# Patient Record
Sex: Female | Born: 1970 | Race: White | Hispanic: No | Marital: Married | Smoking: Never smoker
Health system: Southern US, Community
[De-identification: ages and names within clinical notes are randomized; demographics above are authoritative.]

## PROBLEM LIST (undated history)

## (undated) DIAGNOSIS — K602 Anal fissure, unspecified: Secondary | ICD-10-CM

## (undated) DIAGNOSIS — F32A Depression, unspecified: Secondary | ICD-10-CM

## (undated) DIAGNOSIS — E785 Hyperlipidemia, unspecified: Secondary | ICD-10-CM

## (undated) DIAGNOSIS — T7840XA Allergy, unspecified, initial encounter: Secondary | ICD-10-CM

## (undated) DIAGNOSIS — F419 Anxiety disorder, unspecified: Secondary | ICD-10-CM

## (undated) DIAGNOSIS — F329 Major depressive disorder, single episode, unspecified: Secondary | ICD-10-CM

## (undated) DIAGNOSIS — F988 Other specified behavioral and emotional disorders with onset usually occurring in childhood and adolescence: Secondary | ICD-10-CM

## (undated) DIAGNOSIS — I4901 Ventricular fibrillation: Secondary | ICD-10-CM

## (undated) DIAGNOSIS — I4729 Other ventricular tachycardia: Secondary | ICD-10-CM

## (undated) DIAGNOSIS — Z8669 Personal history of other diseases of the nervous system and sense organs: Secondary | ICD-10-CM

## (undated) HISTORY — DX: Depression, unspecified: F32.A

## (undated) HISTORY — DX: Other ventricular tachycardia: I47.29

## (undated) HISTORY — PX: OTHER SURGICAL HISTORY: SHX169

## (undated) HISTORY — DX: Personal history of other diseases of the nervous system and sense organs: Z86.69

## (undated) HISTORY — DX: Ventricular fibrillation: I49.01

## (undated) HISTORY — DX: Allergy, unspecified, initial encounter: T78.40XA

## (undated) HISTORY — DX: Anxiety disorder, unspecified: F41.9

## (undated) HISTORY — DX: Other specified behavioral and emotional disorders with onset usually occurring in childhood and adolescence: F98.8

## (undated) HISTORY — PX: FINGER SURGERY: SHX640

## (undated) HISTORY — DX: Anal fissure, unspecified: K60.2

## (undated) HISTORY — DX: Hyperlipidemia, unspecified: E78.5

---
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
## 1998-10-01 ENCOUNTER — Emergency Department (HOSPITAL_COMMUNITY): Admission: EM | Admit: 1998-10-01 | Discharge: 1998-10-01 | Payer: Self-pay | Admitting: Emergency Medicine

## 1998-10-02 ENCOUNTER — Encounter: Payer: Self-pay | Admitting: Emergency Medicine

## 1999-07-31 ENCOUNTER — Ambulatory Visit (HOSPITAL_COMMUNITY): Admission: RE | Admit: 1999-07-31 | Discharge: 1999-07-31 | Payer: Self-pay | Admitting: *Deleted

## 1999-10-15 ENCOUNTER — Other Ambulatory Visit: Admission: RE | Admit: 1999-10-15 | Discharge: 1999-10-15 | Payer: Self-pay | Admitting: *Deleted

## 2000-01-12 ENCOUNTER — Inpatient Hospital Stay (HOSPITAL_COMMUNITY): Admission: AD | Admit: 2000-01-12 | Discharge: 2000-01-12 | Payer: Self-pay | Admitting: Obstetrics and Gynecology

## 2000-03-31 ENCOUNTER — Inpatient Hospital Stay (HOSPITAL_COMMUNITY): Admission: AD | Admit: 2000-03-31 | Discharge: 2000-03-31 | Payer: Self-pay | Admitting: *Deleted

## 2000-04-07 ENCOUNTER — Inpatient Hospital Stay (HOSPITAL_COMMUNITY): Admission: AD | Admit: 2000-04-07 | Discharge: 2000-04-09 | Payer: Self-pay | Admitting: *Deleted

## 2001-06-16 ENCOUNTER — Other Ambulatory Visit: Admission: RE | Admit: 2001-06-16 | Discharge: 2001-06-16 | Payer: Self-pay | Admitting: *Deleted

## 2002-08-07 ENCOUNTER — Other Ambulatory Visit: Admission: RE | Admit: 2002-08-07 | Discharge: 2002-08-07 | Payer: Self-pay | Admitting: *Deleted

## 2003-11-12 ENCOUNTER — Emergency Department (HOSPITAL_COMMUNITY): Admission: EM | Admit: 2003-11-12 | Discharge: 2003-11-13 | Payer: Self-pay | Admitting: Emergency Medicine

## 2004-03-23 HISTORY — PX: ABDOMINAL HYSTERECTOMY: SHX81

## 2004-04-25 ENCOUNTER — Other Ambulatory Visit: Admission: RE | Admit: 2004-04-25 | Discharge: 2004-04-25 | Payer: Self-pay | Admitting: Obstetrics and Gynecology

## 2004-05-05 ENCOUNTER — Encounter: Admission: RE | Admit: 2004-05-05 | Discharge: 2004-05-05 | Payer: Self-pay | Admitting: Family Medicine

## 2004-08-20 ENCOUNTER — Emergency Department (HOSPITAL_COMMUNITY): Admission: EM | Admit: 2004-08-20 | Discharge: 2004-08-20 | Payer: Self-pay | Admitting: Emergency Medicine

## 2004-08-25 ENCOUNTER — Observation Stay (HOSPITAL_COMMUNITY): Admission: RE | Admit: 2004-08-25 | Discharge: 2004-08-26 | Payer: Self-pay | Admitting: Obstetrics and Gynecology

## 2004-08-25 ENCOUNTER — Encounter (INDEPENDENT_AMBULATORY_CARE_PROVIDER_SITE_OTHER): Payer: Self-pay | Admitting: *Deleted

## 2005-12-29 ENCOUNTER — Emergency Department (HOSPITAL_COMMUNITY): Admission: EM | Admit: 2005-12-29 | Discharge: 2005-12-29 | Payer: Self-pay | Admitting: Emergency Medicine

## 2006-03-23 LAB — HM MAMMOGRAPHY

## 2006-12-29 ENCOUNTER — Ambulatory Visit: Payer: Self-pay | Admitting: Internal Medicine

## 2006-12-29 LAB — CONVERTED CEMR LAB
BUN: 11 mg/dL (ref 6–23)
Chloride: 108 meq/L (ref 96–112)
Eosinophils Absolute: 0.1 10*3/uL (ref 0.0–0.6)
Eosinophils Relative: 2.4 % (ref 0.0–5.0)
HCT: 39.7 % (ref 36.0–46.0)
Lymphocytes Relative: 27.9 % (ref 12.0–46.0)
Monocytes Relative: 8.7 % (ref 3.0–11.0)
Neutrophils Relative %: 59.8 % (ref 43.0–77.0)
Potassium: 3.9 meq/L (ref 3.5–5.1)
RBC: 4.43 M/uL (ref 3.87–5.11)
T3, Free: 2.8 pg/mL (ref 2.3–4.2)
TSH: 1.46 microintl units/mL (ref 0.35–5.50)
WBC: 5.3 10*3/uL (ref 4.5–10.5)

## 2007-01-11 ENCOUNTER — Encounter: Payer: Self-pay | Admitting: Internal Medicine

## 2007-01-11 ENCOUNTER — Ambulatory Visit: Payer: Self-pay

## 2007-01-11 ENCOUNTER — Ambulatory Visit: Payer: Self-pay | Admitting: Internal Medicine

## 2007-05-05 ENCOUNTER — Ambulatory Visit: Payer: Self-pay | Admitting: Family Medicine

## 2008-08-21 ENCOUNTER — Ambulatory Visit: Payer: Self-pay | Admitting: Family Medicine

## 2008-12-18 ENCOUNTER — Ambulatory Visit: Payer: Self-pay | Admitting: Family Medicine

## 2008-12-26 ENCOUNTER — Ambulatory Visit: Payer: Self-pay | Admitting: Radiology

## 2008-12-26 ENCOUNTER — Ambulatory Visit (HOSPITAL_BASED_OUTPATIENT_CLINIC_OR_DEPARTMENT_OTHER): Admission: RE | Admit: 2008-12-26 | Discharge: 2008-12-26 | Payer: Self-pay | Admitting: Neurosurgery

## 2009-07-17 ENCOUNTER — Ambulatory Visit (HOSPITAL_COMMUNITY): Admission: RE | Admit: 2009-07-17 | Discharge: 2009-07-17 | Payer: Self-pay | Admitting: Neurosurgery

## 2009-07-22 ENCOUNTER — Encounter: Admission: RE | Admit: 2009-07-22 | Discharge: 2009-07-22 | Payer: Self-pay | Admitting: Neurosurgery

## 2010-08-05 NOTE — Assessment & Plan Note (Signed)
[facility]                            CARDIOLOGY OFFICE NOTE   RON, JUNCO                      MRN:          [id number]  DATE:12/29/2006                            DOB:          Jul 11, 1970    NEW PATIENT EVALUATION   PRIMARY CARE Darielle Hancher:  Elpidio Galea, P.A.   REASON FOR EVALUATION:  Chest pain and palpitations.   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  She has a history of migraine headaches which were  likely hormonally related and have gotten better since her hysterectomy  in 2006.  She also has a history of palpitations due to frequent PVCs.  She previously has been worked up with an echocardiogram and Holter  monitor which was essentially unremarkable except for question of mild  mitral valve prolapse and frequent PVCs.  She previously was a fairly  heavy exerciser but has not done so for 2 years.  However, she is quite  active at work without problem and also manages to take care of her  three kids.   Over the past few months she has been noticing more frequent  palpitations.  She gets episodes about once or twice a week.  They last  15-20 minutes where her heart feels irregular, not necessarily fast just  irregular, and she has a funny feeling.  She has tried to catch these on  the monitor but by the time she hooks herself up they are gone.  She  also has intermittent chest pain which feels vise-like.  Last night  while at work while sitting she developed severe midsternal chest pain  radiating across her left chest.  There was no nausea or shortness of  breath associated with it but it lasted for some time and she still has  some mild residual pain.  She denies any associated reflux with this or  indigestion.   REVIEW OF SYSTEMS:  Is essentially normal in all systems except for HPI  and problem list.   PROBLEM LIST:  1. Palpitations secondary to frequent PVCs, question mild  mitral valve      prolapse.  2. Migraine headaches.  3. Intermittent disequilibrium treated with Antivert.   CURRENT MEDICATIONS:  1. Antivert 25 mg a day.  2. Tramadol 25 mg p.r.n.  3. Xanax 0.5 mg p.r.n.   ALLERGIES:  FLAGYL.   SOCIAL HISTORY:  She is married with three children.  She is an Soil scientist.  No tobacco, occasional alcohol.   FAMILY HISTORY:  Mother is 17 years old and has mitral valve prolapse  and a low EF.  Father is 65 and healthy.  Sister is 24 and healthy.  Other sister is 38 and healthy.  She has two brothers, one of which is  48 and has had an ablation for Wolff-Parkinson-White syndrome.   PHYSICAL EXAMINATION:  She is somewhat fatigued appearing but otherwise  healthy.  She ambulates around the clinic without any respiratory  difficulty.  Blood pressure is 102/60, heart rate is 78, weight is 122.  HEENT:  Normal.  NECK:  Supple.  There is no JVD.  Carotids are 2+ bilaterally without  any bruits.  There is no lymphadenopathy or thyromegaly.  CARDIAC:  PMI is nondisplaced.  She has a regular rate and rhythm.  No  murmurs, rubs or gallops.  I do not hear a click.  LUNGS:  Clear.  ABDOMEN:  Soft, nontender, nondistended.  No hepatosplenomegaly, no  bruits, no masses, good bowel sounds.  EXTREMITIES:  Warm with no cyanosis, clubbing or edema.  No rash.  Good  distal pulses.  No cords.  NEUROLOGIC:  Alert and oriented x3.  Cranial nerves II-XII are intact.  Moves all four extremities without difficulty.  Affect is pleasant.   EKG shows normal sinus rhythm at a rate of 88.  There is an RSR-prime  with a normal QRS duration at 84 milliseconds.  She has mild ST  diffusely.   ASSESSMENT AND PLAN:  1. Palpitations.  I suspect these are more of her PVCs but they have      become more frequent, may be stress and sleep deprivation      triggering these.  We will get a 30-day event monitor to further      evaluate and will start her on Toprol-XL 25 mg a day.  2.  Chest pain.  This has both typical and atypical features.  Given      her age, I think it is unlikely that it is ischemic.  We will go      ahead and get a stress echocardiogram to evaluate her myocardial      structure and function.  If everything comes back normal I did      discuss the possibility of starting a proton pump inhibitor to see      if this helps.  I do suspect that the great amount of stress that      she is under is also playing a role.   DISPOSITION:  Will be based on the results of her testing.     Bevelyn Buckles. Bensimhon, MD  Electronically Signed    DRB/MedQ  DD: 12/29/2006  DT: 12/30/2006  Job #: [id number]

## 2010-08-08 NOTE — H&P (Signed)
[place]  Patient:    Shirley Romero, Shirley Romero                      MRN: [id number] Adm. Date:  [id number] Attending:  Donne Hazel                         History and Physical  HISTORY OF PRESENT ILLNESS:  Ms. Zogg is a 40 year old married female, GII, PII, admitted for laparoscopy and right salpingo-oophorectomy and bilateral tubal ligation.  The patient has had two vaginal deliveries and requests permanent, voluntary sterilization.  She has also had recurrent right pelvic pain, recurrent right ovarian cysts.  After informed consent, and different options for treatment of ovarian cysts, she has requested removal of the right ovary.  This was explained to her thoroughly.  Also, the risks, benefits and failure rate of tubal ligation were reviewed with her.  MEDICAL HISTORY: 1. History of kidney stones. 2. History of pilonidal cyst. 3. History of mitral valve prolapse.  SURGICAL HISTORY: 1. Pilonidal cyst excision. 2. Knee surgery x 2. 3. Right index finger surgery x 1. 4. Kidney stone removal.  OB HISTORY:  Normal spontaneous vaginal delivery x 2, at term.  MEDICATIONS:  Vicodin p.r.n.  ALLERGIES:  FLAGYL.  PHYSICAL EXAMINATION:  GENERAL:  She is a well-developed, well-nourished female in no acute distress.  VITAL SIGNS:  Stable; temp 97.0, pulse 71, respirations 20 and blood pressure 98/57.  HEENT:  Within normal limits.  NECK:  Supple without adenopathy or thyromegaly.  HEART:  Regular rate and rhythm without murmurs, rubs or gallops.  LUNGS:  Clear to auscultation.  ABDOMEN:  Soft, benign and nontender without masses, tenderness or organomegaly.  BREASTS:  Exam is deferred upon admission, but has been normal within the last year.  EXTREMITIES:  Grossly normal.  NEUROLOGIC:  Grossly normal.  PELVIC:  Normal external female genitalia.  Vagina/cervix clear.  The uterus is  small, nontender and mobile.  Adnexa  clear.  No masses or tenderness identified  upon admission.  ADMITTING DIAGNOSES: 1. Requests permanent, voluntary sterilization. 2. Recurrent right ovarian cysts. 3. Recurrent right pelvic pain.  PLAN:  Laparoscopy, with right salpingo-oophorectomy and bilateral tubal ligation.  DISCUSSION:  The risks and benefits of surgery were explained to the patient. he risks and benefits of oophorectomy were reviewed with the patient.  Also, she requested permanent, tubal sterilization.  Failure rate of 1 in 200 quoted regarding this procedure.  Questions were entertained and the patient wished [place] to proceed. DD:  07/31/98 TD:  07/31/99 Job: [id number]

## 2010-08-08 NOTE — H&P (Signed)
NAMEEDEN, TOOHEY               ACCOUNT NO.:  [id number]   MEDICAL RECORD NO.:  [id number]          PATIENT TYPE:  AMB   LOCATION:  SDC                           [facility]ACILITY:  [facility]   PHYSICIAN:  Dineen Kid. Rana Snare, M.D.    DATE OF BIRTH:  10-10-1970   DATE OF PROCEDURE:  DATE OF DISCHARGE:                      STAT - MUST CHANGE TO CORRECT WORK TYPE   HISTORY OF PRESENT ILLNESS:  Ms. Beauchesne is a 40 year old, G3, P3, who has  had a tubal ligation in the past.  She has been having ongoing problems with  dyspareunia, pelvic pain, menorrhagia, and dysmenorrhea.  Previously was  scheduled for laparoscopic assisted vaginal hysterectomy, had to cancel it  for social reasons.  She does have a history of endometriosis years ago had  not really improved with laparoscopy, birth control pills, or Advil, and at  this time she desires definitive surgical intervention and planned  laparoscopic assisted vaginal hysterectomy and presents for this.   PAST MEDICAL HISTORY:  1.  History of migraines.  2.  History of kidney stones.   PAST SURGICAL HISTORY:  1.  She has had a reconstruction of her fingers.  2.  Surgery on her knees.  3.  She has had a laparoscopic RSO and a tubal ligation in May 2001.  4.  She has had three vaginal deliveries.  5.  She has had two tubal ligations.   ALLERGIES:  FLAGYL.   MEDICATIONS:  Xanax as needed, Antivert or Tramadol as needed.   PHYSICAL EXAMINATION:  VITAL SIGNS:  Her blood pressure is 108/66, her  weight is 139.  HEART:  Regular rate and rhythm.  LUNGS:  Clear to auscultation bilaterally.  ABDOMEN:  Nondistended, nontender.  PELVIC:  Uterus is anteverted, mobile, nontender.  No adnexal masses are  palpable.   IMPRESSION:  1.  Dyspareunia.  2.  Pelvic pain.  3.  History of endometriosis.  4.  Menorrhagia.  5.  Dysmenorrhea.  All not responsive to conservative medical management.  The patient has no  further childbearing desires, desires definitive  surgical intervention.   PLAN:  Laparoscopic assisted vaginal hysterectomy.  The risks and benefits  of the procedure were discussed which include but are not limited to risk of  infection, bleeding, damage to ureters, bowel, bladder, possibility this may  not alleviate pain, it may recur, possibility of bleeding which may require  a blood transfusion, risks associated with anesthesia.  She does give her  informed consent and wishes to proceed.      DCL/MEDQ  D:  08/22/2004  T:  08/22/2004  Job:  [id number]

## 2010-08-08 NOTE — Op Note (Signed)
[facility]  Patient:    Shirley Romero, Shirley Romero                      MRN: [id number] Proc. Date: 07/31/99 Adm. Date:  [id number] Attending:  Donne Hazel                           Operative Report  PREOPERATIVE DIAGNOSIS:       Request for permanent, voluntary sterilization. Recurrent right ovarian cyst.  Recurrent right pelvic pain.  POSTOPERATIVE DIAGNOSIS:      Request for permanent, voluntary sterilization. Recurrent right ovarian cyst.  Recurrent right pelvic pain.  OPERATION:                    Laparoscopic bilateral tubal ligation.  Right salpingo-oophorectomy.  Removal of left paratubal cyst.  SURGEON:                      R. Alan Mulder, M.D.  ASSISTANT:  ANESTHESIA:                   General anesthesia.  ESTIMATED BLOOD LOSS:         Less than 20 cc.  COMPLICATIONS:                None.  FINDINGS:                     At the time of surgery, the pelvis was thoroughly  visualized.  The remarkable findings include a small area consistent wtih endometriosis on the underside of the right ovary.  This was a small area. There was a 1 x 1 cm left paratubal cyst that was removed.  Bilateral tubal ligation as carried out.  All right salpingo-oophorectomy carried out without complications. The uterus and pelvis otherwise were normal.  The bowel, appendix, and upper abdomen were visualized briefly and noted to be normal.  DESCRIPTION OF PROCEDURE:     The patient was taken to the operating room where a general endotracheal anesthesia was administered.  The patient was placed on the operating table in the dorsal lithotomy position.  The abdomen, perineum, and vagina were prepped and draped in the usual sterile fashion with Betadine and sterile drapes.  The bladder was emptied with a red rubber catheter.  Next, a small vertical infraumbilical skin incision was made through which a Veress needle was inserted atraumatically into the  abdominal cavity.  A pneumoperitoneum was created with 2 liters of carbon dioxide gas.  The Veress needle was removed and a disposable laparoscopic trocar was inserted atraumatically into the abdominal cavity.  The laparoscope was then inserted with the above noted findings.  An accessory incision was made two fingerbreadths above the pubic symphysis and in the midline.  A 5 mm trocar was placed through this.  This was done atraumatically nd under direct, continuous laparoscopic guidance.  First left salpingo-oophorectomy was undertaken.  There was an area of questionable endometriosis on the right ovary, but it otherwise was normal.  The infundibulopelvic ligament was grasped  with a bipolar cautery and thoroughly cauterized.  This was done well away from the ureter.  The ureter was identified before and after resection of the right ovary. The ovary was removed with a burn and cut technique.  The infundibulopelvic ligament was thoroughly cauterized with the bipolar cautery unit.  The ovary was then dissected  from the underlying mesosalpinx.  This was done atraumatically. The left paratubal cyst was removed sharply after the base of this was also cauterized. Bilateral tubal ligation was carried out, first on the left tube.  The tube was  traced to its fimbriated end to ensure its positive identification.  The tube was then grasped with the bipolar cautery approximately 2 cm away from the uterine fundus.  This was cauterized x 3 paddlewidths of the Kleppinger bipolar cautery. Good cautery was noted and a representative picture was taken of this.  The right tube was similarly ligated by cautery.  The distal 2/3 of the tube was missing ue to the excision of the ovary previously.  The pelvis was then thoroughly irrigated with copious amounts of irrigant.  Hemostasis was noted.  Representative pictures were taken.  All of the gas was allowed to escape from the abdomen  and approximately 200 cc of Ringers lactate was left inside the abdomen.  The gas was allowed to escape and all abdominal instruments were removed.  The umbilical site was closed first with two interrupted sutures on the muscle fascia with 0 Vicryl. The skin was then reapproximated with a running subcuticular stitch of 3-0 Vicryl Rapide.  10 cc of 0.25% Marcaine were then used to infiltrate the incisions. All vaginal instruments were removed.  Sterile dressings applied.  The patient was awakened, extubated, and taken to the recovery room in good condition.  There were no perioperative complications. DD:  07/31/99 TD:  07/31/99 Job: [id number]

## 2010-08-08 NOTE — Op Note (Signed)
NAMEBRITTIANY, Shirley Romero               ACCOUNT NO.:  [id number]   MEDICAL RECORD NO.:  [id number]          PATIENT TYPE:  OBV   LOCATION:  9399                          FACILITY:  [facility]   PHYSICIAN:  Dineen Kid. Rana Snare, M.D.    DATE OF BIRTH:  1970-10-18   DATE OF PROCEDURE:  08/25/2004  DATE OF DISCHARGE:                                 OPERATIVE REPORT   PREOPERATIVE DIAGNOSIS:  Dyspareunia, pelvic pain, history of endometriosis,  menorrhagia, and dysmenorrhea.   POSTOPERATIVE DIAGNOSIS:  Dyspareunia, pelvic pain, history of  endometriosis, menorrhagia, and dysmenorrhea.   PROCEDURE:  Laparoscopically-assisted vaginal hysterectomy.   SURGEON:  Dineen Kid. Rana Snare, M.D.   ASSISTANT:  Juluis Mire, M.D.   ANESTHESIA:  General endotracheal.   INDICATIONS FOR PROCEDURE:  Ms. Everly is a 40 year old gravida 3, para 3,  who has had ongoing problems with dyspareunia, pelvic pain, menorrhagia,  dysmenorrhea, and has had a history of endometriosis years ago.  All of this  has not improved with laparoscopy, birth control pills, or Advil.  She does  at this time desire a more definitive surgical intervention and planned  laparoscopically-assisted vaginal hysterectomy.  Previously had a  laparoscopic right salpingo-oophorectomy with some improvement from that.  She desires preservation of her left ovary.  Risks and benefits of the  procedure were discussed at length.  Informed consent was obtained.  See  history and physical for further details.   FINDINGS:  Surgically absent right tube and ovary.  Left fallopian tube  tubal remnants were noted.  No evidence of endometriosis was noted.  Uterus  was slightly boggy.  Normal appearing appendix, normal appearing gallbladder  and liver.   DESCRIPTION OF PROCEDURE:  After adequate analgesia, the patient was placed  in the dorsal lithotomy position.  She was sterilely prepped and draped.  The bladder was sterilely drained.  A Hulka tenaculum was placed on  the  cervix.  A 1 cm infraumbilical skin incision was made.  A Veress needle was  inserted.  The abdomen was insufflated to dullness to percussion.  11 mm  trocar was inserted.  The laparoscope was inserted.  The above findings were  noted.  A 5 mm trocar was inserted to the left of the midline two  fingerbreadths above the pubic symphysis under direct visualization.  A  Gyrus tripolar was used to coagulate and dissect along the right broad  ligament down across the round ligament just to above the uterine  vasculature.  The left utero-ovarian ligament was identified, ligated, and  dissected down across the round ligament with the ovary and fallopian tube  falling laterally.  Good hemostasis was achieved.  The bladder was then  elevated.  The uterovesical junction was noted and a small incision was made  and a bladder flap was created.  The abdomen was then desufflated.  Legs  were repositioned.  Weighted speculum placed in the vagina.  Posterior  colpotomy was performed.  The cervix was circumscribed with Bovie cautery.  A LigaSure instrument was used to ligate across the uterosacral ligaments  bilaterally, dissected with the [facility]  scissors.  The bladder pillars were  then dissected.  The anterior vaginal mucosa was then entered and anterior  peritoneum was entered and Deaver retractor placed below the bladder.  LigaSure was used to ligate across the uterine vasculature bilaterally and  the inferior portions of the broad ligament and the uterus was removed.  Good hemostasis was achieved.  Packing was placed.  The uterosacral  ligaments were identified, ligated with 0 Monocryl sutures in figure-of-  eight fashion.  The posterior peritoneum was then closed in a pursestring  fashion with 0 Monocryl suture.  The posterior vaginal mucosa was closed in  a vertical fashion using 0 Monocryl in a figure-of-eight fashion.  The  packing was then removed and the anterior vaginal mucosa was closed in  a  similar fashion plicating the vagina with 0 Monocryl sutures in a figure-of-  eight fashion with good approximation and good hemostasis.  Weighted  speculum was removed.  Foley catheter was placed with return of clear yellow  urine.  The legs were repositioned.  The abdomen reinsufflated.  Peritoneal  edges along the bladder and vasculature were coagulated with Gyrus bipolar.  The remnants of the distal end of the fallopian tube were elevated and  easily dissected with a Gyrus tripolar LigaSure with good hemostasis  achieved.  The pieces were easily removed.  The left ovary appeared to be  very normal and well vascularized.  There was no evidence of residual  bleeding.  The abdomen was then desufflated.  Trocars were removed.  The  infraumbilical skin incision was closed with 0 Vicryl figure-of-eights in  the fascia and a 3-0 Vicryl Rapide subcuticular suture.  5 mm site was  closed with a 3-0 Vicryl Rapide subcuticular suture.  The incisions were  injected with 0.25% Marcaine 10 mL total used.  Needle, sponge, and  instrument counts correct x3.  The patient received 1 gram of Rocephin  preoperatively.  Estimated blood loss was 100 mL.      DCL/MEDQ  D:  08/25/2004  T:  08/25/2004  Job:  [id number]

## 2010-08-08 NOTE — Discharge Summary (Signed)
Shirley Romero, Shirley Romero               ACCOUNT NO.:  [id number]   MEDICAL RECORD NO.:  [id number]          PATIENT TYPE:  OBV   LOCATION:  9318                          [facility]ACILITY:  [facility]   PHYSICIAN:  Dineen Kid. Rana Snare, M.D.    DATE OF BIRTH:  01-24-1971   DATE OF ADMISSION:  08/25/2004  DATE OF DISCHARGE:                                 DISCHARGE SUMMARY   HISTORY OF PRESENT ILLNESS:  Shirley Romero is a 40 year old G3 P3 with ongoing  problems of dyspareunia, pelvic pain, menorrhagia, dysmenorrhea, and history  of endometriosis. All of this is not improved by laparoscopy, birth control  pills, Motrin. She desires more definitive surgical intervention and plan  laparoscopic-assisted vaginal hysterectomy. Previously, she had had a  laparoscopic RSO with some improvement after that. She does desire  preservation of her left ovary. The risks and benefits were discussed at  length, informed consent was obtained. See the History and Physical for  further details.   HOSPITAL COURSE:  The patient was admitted the same day of surgery. She  underwent a laparoscopic-assisted vaginal hysterectomy with a left  salpingectomy. The procedure was uncomplicated, estimated blood loss 100 mL.  Her postoperative course was unremarkable. By postoperative day #1 she was  tolerating a regular diet, ambulating without difficulty, incision was  clean, dry, and intact. Her postoperative hemoglobin was 10.4 and the  patient was discharged home. Will follow up in the office in 2 weeks.   DISPOSITION:  The patient will be discharged home. Follow up in 2 weeks in  the office. Told to return for increased pain, fever, or bleeding. Sent home  with a prescription for Tylox #30.       DCL/MEDQ  D:  08/26/2004  T:  08/26/2004  Job:  [id number]

## 2011-04-21 IMAGING — CR DG MYELOGRAM LUMBAR
2 series · 2 of 2 positions shown · IV contrast (omnipaque)
Comparison: Lumbar MRI 12/26/2008.

MYELOGRAM LUMBAR

CLINICAL DATA: 39-year-old female with back pain radiating to the
left leg with left extensor hallucis longus weakness.
TECHNIQUE: Intrathecal contrast was administered by Dr. Haruna
Gaskins  via lumbar puncture at the L3-L4 level. Following
injection of intrathecal Omnipaque contrast, spine imaging in
multiple projections was performed using fluoroscopy.

Fluoroscopy Time: 1.8 minutes.
TECHNIQUE: CT imaging of the lumbar spine was performed after
intrathecal contrast administration.  Multiplanar CT image
reconstructions were also generated.

[view not recorded (1 of 2)]
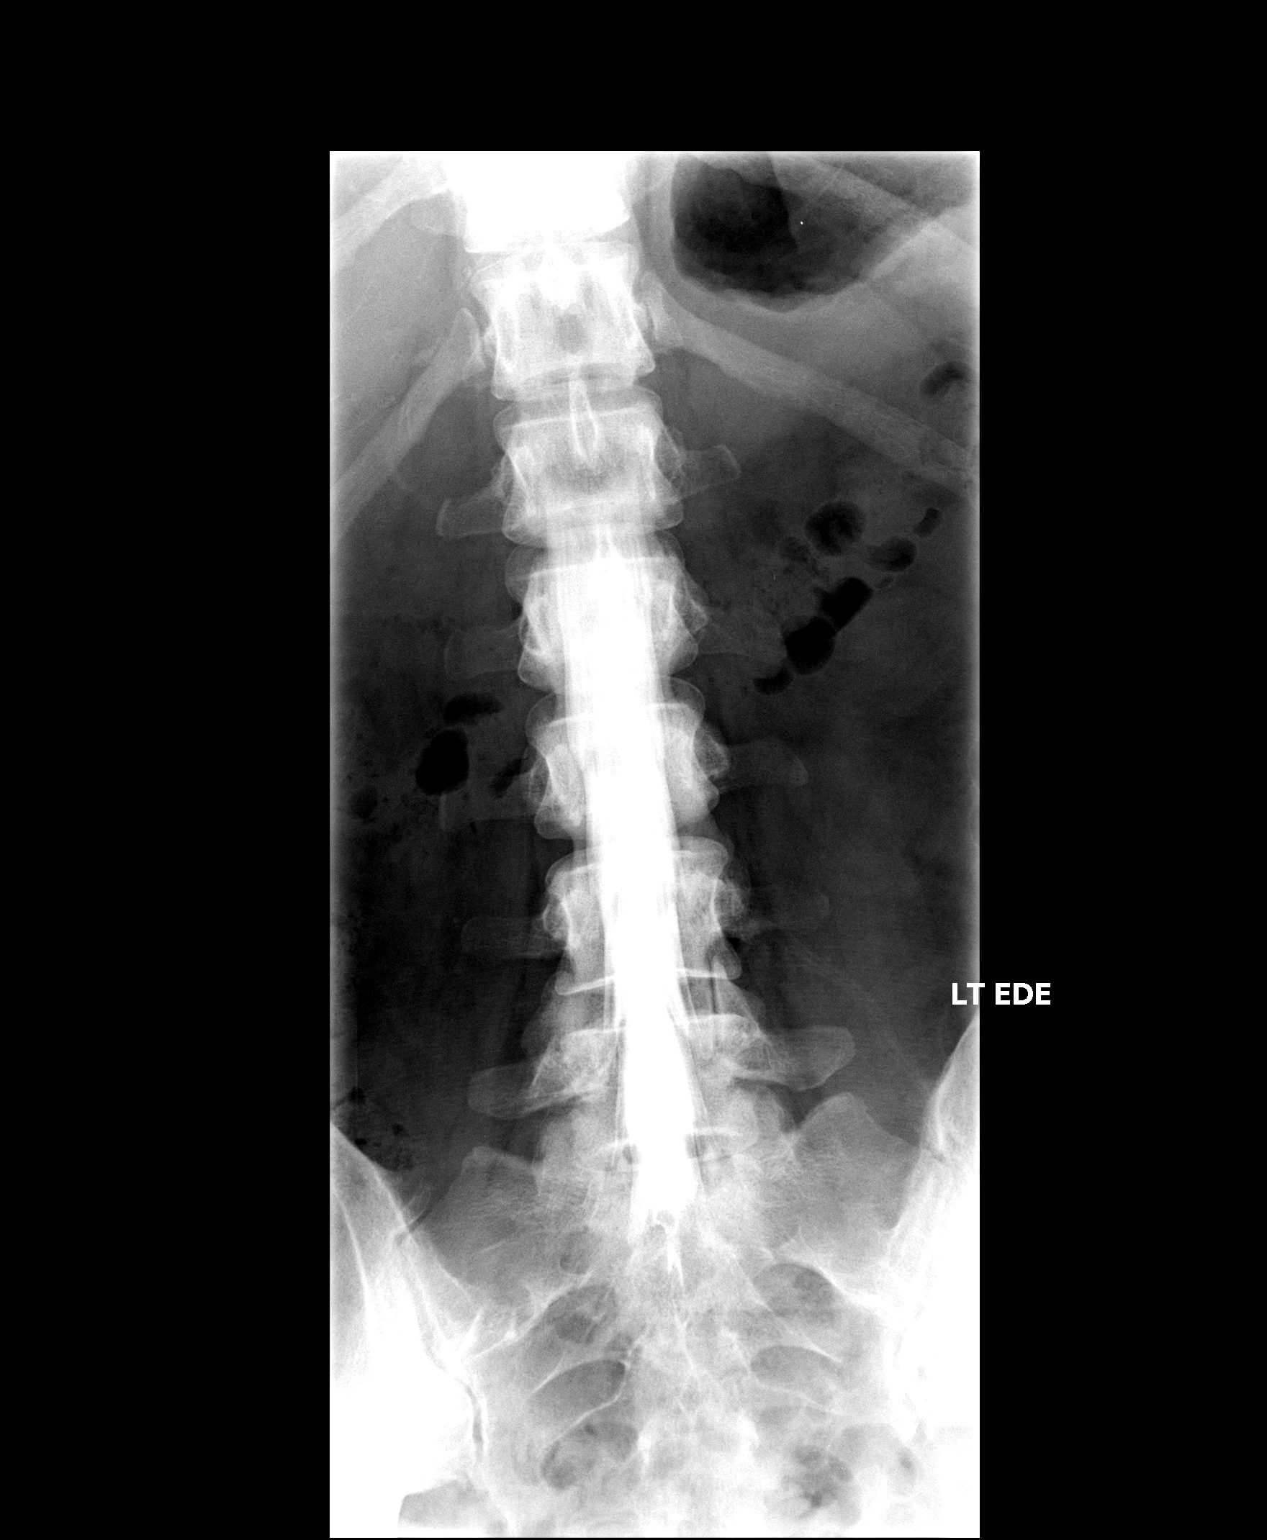

[view not recorded (2 of 2)]
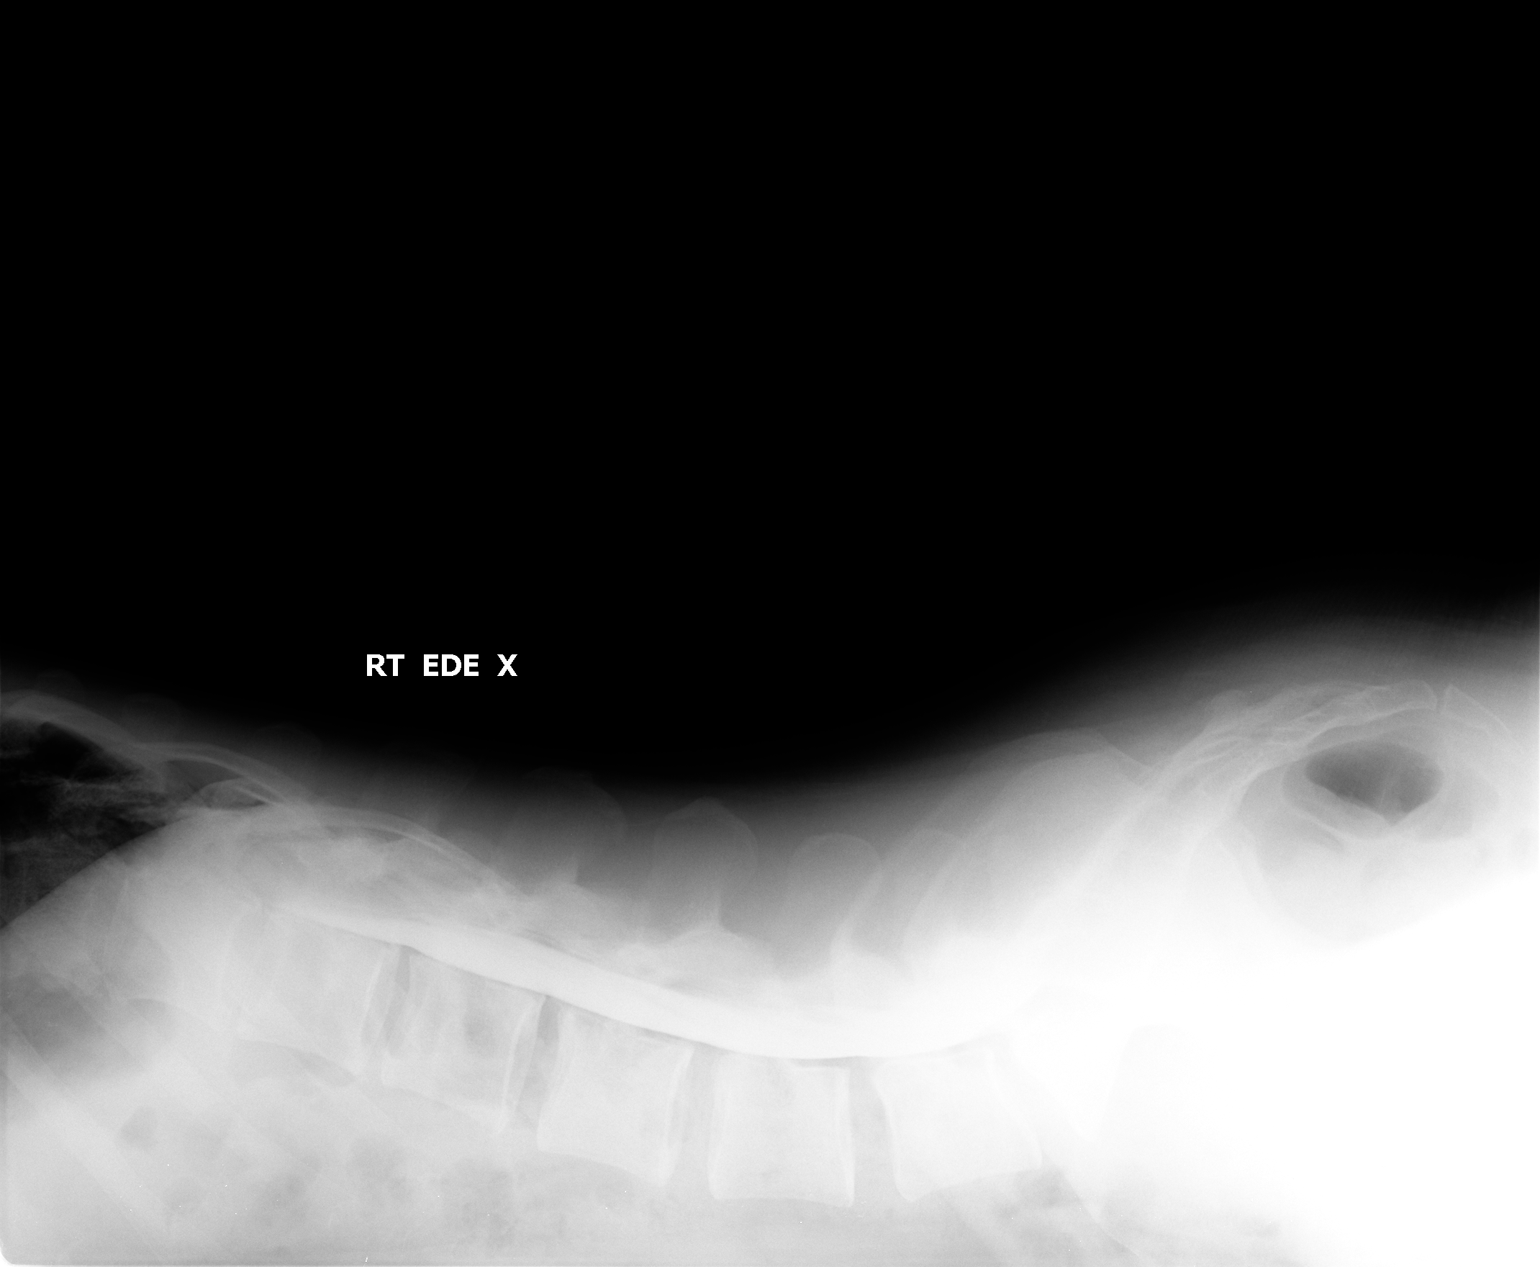

[2 of 2 positions shown; findings below may reference images not displayed]

FINDINGS: Good intrathecal contrast opacification.  Normal lumbar
segmentation.  Mild ventral defect at the L4-L5 level on the thecal
sac.  No lumbar spinal stenosis suspected.  A symmetric appearance
of the exiting lumbar nerve root on oblique views bilaterally.
IMPRESSION: 1.  No definite spinal or lateral recess stenosis.
2.  See post myelogram CT findings below.

CT MYELOGRAPHY LUMBAR SPINE
FINDINGS: Good intrathecal contrast opacification.  Small
dystrophic calcification at the bladder dome of doubtful clinical
significance.  Otherwise negative visualized abdominal and pelvic
viscera. Bone mineralization is within normal limits. Stable,
normal vertebral body height and alignment.  Visualized sacrum and
SI joints are within normal limits.  Normal appearance of the conus
medullaris at L1-L2.

T12-L1:  Negative.  Probable incidental left perineural cyst
containing contrast.

L1-L2:  Negative.

L2-L3:  Negative.

L3-L4:  Negative disc.  Mild facet hypertrophy, greater on the
left.  No stenosis.

L4-L5:  No focal disc protrusion is identified.  There is mild
bilateral facet hypertrophy.  No synovial cyst is identified.  No
spinal, lateral recess or foraminal stenosis is identified. There
is less filling of the left L4 nerve root sleeve with contrast, of
unknown significance.

L5-S1:  Small central broad based disc protrusion is re-identified
(series 103 image 33).  Disc material is in close proximity to the
descending S1 nerve roots, but no lateral recess stenosis occurs.
No involvement of the exiting L5 nerve roots.  No spinal stenosis.

There is a slightly unusual ventral epidural contour abnormality at
the S1-S2 level.  On axial images this is more associated with the
right S2 nerve roots (series 3 image 79) and it does not appear
associated with any of the left side sacral roots.
IMPRESSION: 1.  No convincing spinal stenosis or neural compression identified.
2.  Small central broad based disc protrusion at L5-S1 re-
identified.
3.  Small somewhat unusual ventral epidural contour abnormality at
the S1-S2 level to the right of midline.  This is of doubtful
clinical significance.

I reviewed this case with Dr. Nya Jumper at 5231 hours on
07/17/2009.

## 2011-04-21 IMAGING — CT CT L SPINE W/ CM
4 of 11 series · 7 of 33 positions shown, 8 images · IV contrast (omnipaque)
Comparison: Lumbar MRI 12/26/2008.

MYELOGRAM LUMBAR

CLINICAL DATA: 39-year-old female with back pain radiating to the
left leg with left extensor hallucis longus weakness.
TECHNIQUE: Intrathecal contrast was administered by Dr. Haruna
Gaskins  via lumbar puncture at the L3-L4 level. Following
injection of intrathecal Omnipaque contrast, spine imaging in
multiple projections was performed using fluoroscopy.

Fluoroscopy Time: 1.8 minutes.
TECHNIQUE: CT imaging of the lumbar spine was performed after
intrathecal contrast administration.  Multiplanar CT image
reconstructions were also generated.

[Series 2: l-spine · axial · 0.31mm/px · z∈[-273,-193]mm · 2 of 96 slices shown, 3 images]
[im 32/96  soft-tissue]
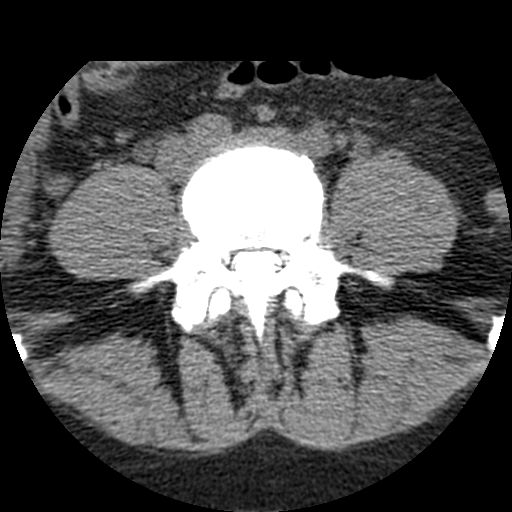
[im 32/96  bone]
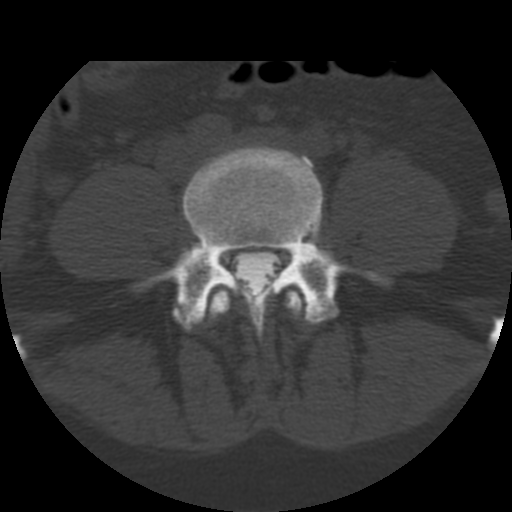
[im 64/96  bone]
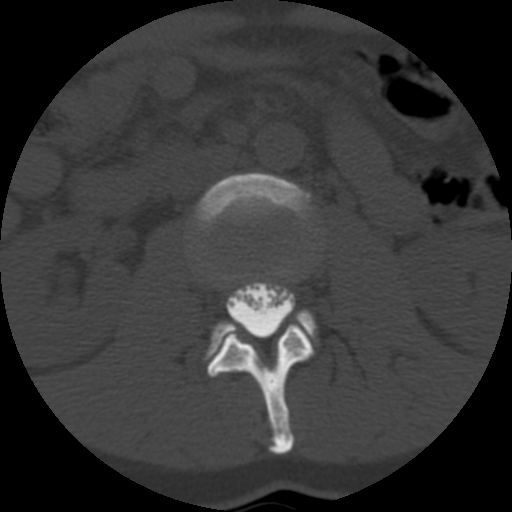

[Series 3: recon 2: l-spine · axial · 0.31mm/px · z∈[-273,-193]mm · 2 of 96 slices shown]
[im 32/96  bone]
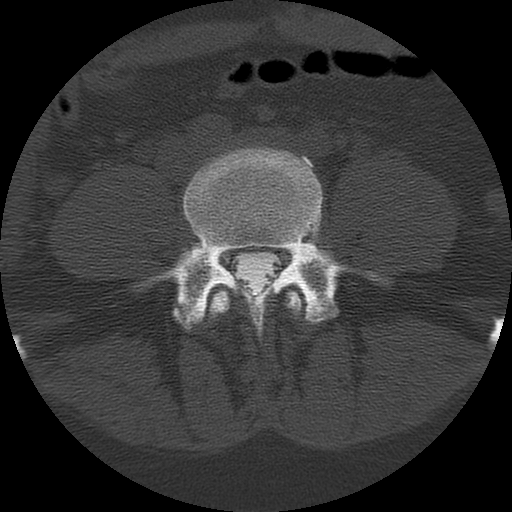
[im 64/96  bone]
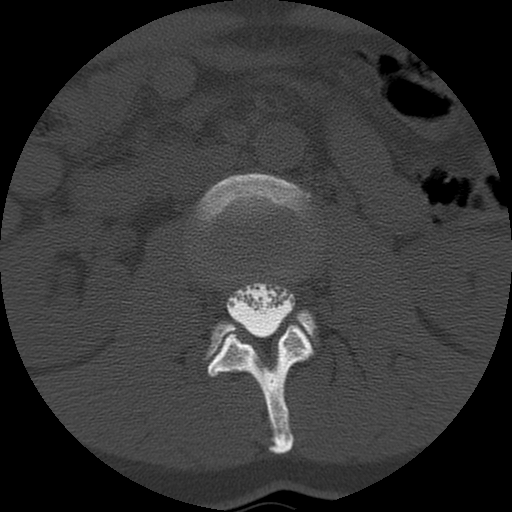

[Series 103: detail sagittals · sagittal · 0.41mm/px · 2 of 41 slices shown]
[im 14/41  bone]
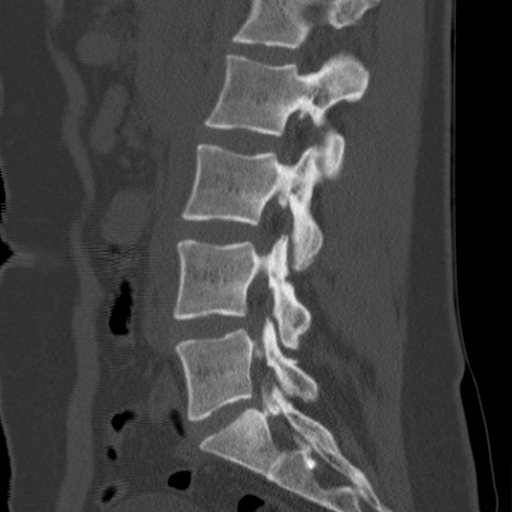
[im 27/41  bone]
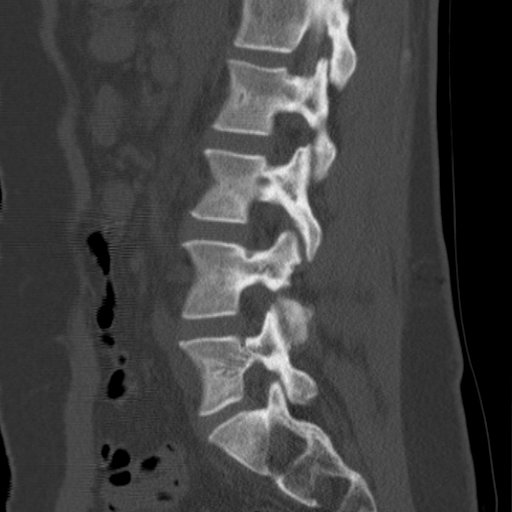

[Series 401: coronal bones · sagittal · 0.48mm/px · 1 of 47 slices shown]
[im 24/47  bone]
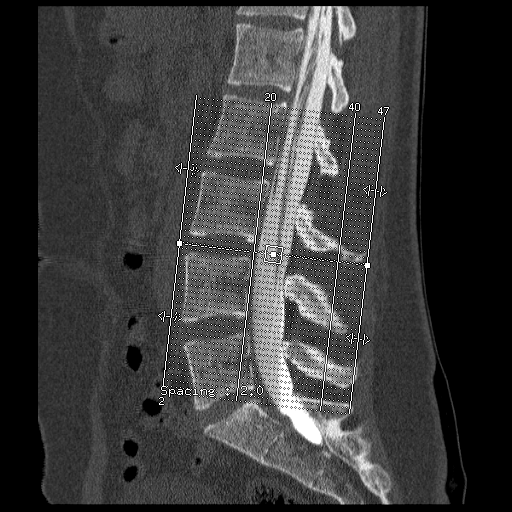

[7 of 33 positions shown; findings below may reference images not displayed]

FINDINGS: Good intrathecal contrast opacification.  Normal lumbar
segmentation.  Mild ventral defect at the L4-L5 level on the thecal
sac.  No lumbar spinal stenosis suspected.  A symmetric appearance
of the exiting lumbar nerve root on oblique views bilaterally.
IMPRESSION: 1.  No definite spinal or lateral recess stenosis.
2.  See post myelogram CT findings below.

CT MYELOGRAPHY LUMBAR SPINE
FINDINGS: Good intrathecal contrast opacification.  Small
dystrophic calcification at the bladder dome of doubtful clinical
significance.  Otherwise negative visualized abdominal and pelvic
viscera. Bone mineralization is within normal limits. Stable,
normal vertebral body height and alignment.  Visualized sacrum and
SI joints are within normal limits.  Normal appearance of the conus
medullaris at L1-L2.

T12-L1:  Negative.  Probable incidental left perineural cyst
containing contrast.

L1-L2:  Negative.

L2-L3:  Negative.

L3-L4:  Negative disc.  Mild facet hypertrophy, greater on the
left.  No stenosis.

L4-L5:  No focal disc protrusion is identified.  There is mild
bilateral facet hypertrophy.  No synovial cyst is identified.  No
spinal, lateral recess or foraminal stenosis is identified. There
is less filling of the left L4 nerve root sleeve with contrast, of
unknown significance.

L5-S1:  Small central broad based disc protrusion is re-identified
(series 103 image 33).  Disc material is in close proximity to the
descending S1 nerve roots, but no lateral recess stenosis occurs.
No involvement of the exiting L5 nerve roots.  No spinal stenosis.

There is a slightly unusual ventral epidural contour abnormality at
the S1-S2 level.  On axial images this is more associated with the
right S2 nerve roots (series 3 image 79) and it does not appear
associated with any of the left side sacral roots.
IMPRESSION: 1.  No convincing spinal stenosis or neural compression identified.
2.  Small central broad based disc protrusion at L5-S1 re-
identified.
3.  Small somewhat unusual ventral epidural contour abnormality at
the S1-S2 level to the right of midline.  This is of doubtful
clinical significance.

I reviewed this case with Dr. Nya Jumper at 5231 hours on
07/17/2009.

## 2012-07-01 ENCOUNTER — Other Ambulatory Visit: Payer: Self-pay | Admitting: Family Medicine

## 2012-08-02 ENCOUNTER — Other Ambulatory Visit: Payer: Self-pay | Admitting: Family Medicine

## 2012-08-03 ENCOUNTER — Telehealth: Payer: Self-pay | Admitting: *Deleted

## 2012-08-03 NOTE — Telephone Encounter (Signed)
30.  Thanks PG

## 2012-08-03 NOTE — Telephone Encounter (Signed)
Pharmacy wants to know if they dispense qty 30 or 90

## 2012-11-11 ENCOUNTER — Ambulatory Visit: Payer: Self-pay | Admitting: Family Medicine

## 2012-11-24 ENCOUNTER — Ambulatory Visit (INDEPENDENT_AMBULATORY_CARE_PROVIDER_SITE_OTHER): Payer: Managed Care, Other (non HMO) | Admitting: Family Medicine

## 2012-11-24 ENCOUNTER — Encounter: Payer: Self-pay | Admitting: Family Medicine

## 2012-11-24 VITALS — BP 131/84 | HR 88 | Ht 66.0 in | Wt 166.0 lb

## 2012-11-24 DIAGNOSIS — Z23 Encounter for immunization: Secondary | ICD-10-CM

## 2012-11-24 DIAGNOSIS — F411 Generalized anxiety disorder: Secondary | ICD-10-CM

## 2012-11-24 DIAGNOSIS — E559 Vitamin D deficiency, unspecified: Secondary | ICD-10-CM | POA: Insufficient documentation

## 2012-11-24 DIAGNOSIS — E785 Hyperlipidemia, unspecified: Secondary | ICD-10-CM | POA: Insufficient documentation

## 2012-11-24 DIAGNOSIS — R41840 Attention and concentration deficit: Secondary | ICD-10-CM

## 2012-11-24 DIAGNOSIS — R209 Unspecified disturbances of skin sensation: Secondary | ICD-10-CM | POA: Insufficient documentation

## 2012-11-24 DIAGNOSIS — K120 Recurrent oral aphthae: Secondary | ICD-10-CM

## 2012-11-24 DIAGNOSIS — G4700 Insomnia, unspecified: Secondary | ICD-10-CM

## 2012-11-24 MED ORDER — LISDEXAMFETAMINE DIMESYLATE 50 MG PO CAPS: 50.0000 mg | ORAL_CAPSULE | ORAL | Status: DC

## 2012-11-24 MED ORDER — ZALEPLON 10 MG PO CAPS: 10.0000 mg | ORAL_CAPSULE | Freq: Every day | ORAL | Status: DC

## 2012-11-24 MED ORDER — ZOLPIDEM TARTRATE 10 MG PO TABS: 10.0000 mg | ORAL_TABLET | Freq: Every evening | ORAL | Status: DC | PRN

## 2012-11-24 MED ORDER — TRIAMCINOLONE ACETONIDE 0.1 % MT PSTE
PASTE | Freq: Two times a day (BID) | OROMUCOSAL | Status: AC
Start: 2012-11-24 — End: 2013-11-24

## 2012-11-24 MED ORDER — FAMCICLOVIR 500 MG PO TABS: ORAL_TABLET | ORAL | Status: DC

## 2012-11-24 MED ORDER — CITALOPRAM HYDROBROMIDE 40 MG PO TABS: 40.0000 mg | ORAL_TABLET | Freq: Every day | ORAL | Status: DC

## 2012-11-24 NOTE — Patient Instructions (Addendum)
1)  Canker Sores - Use the dental paste, Famvir and Duke's Magic Mouthwash  2)  ADD - Try the Vyvanse  3)  BP - Decrease sodium  4)  Insomnia - Sonata - 4 hrs   Ambien 8 hrs    DASH Diet The DASH diet stands for "Dietary Approaches to Stop Hypertension." It is a healthy eating plan that has been shown to reduce high blood pressure (hypertension) in as little as 14 days, while also possibly providing other significant health benefits. These other health benefits include reducing the risk of breast cancer after menopause and reducing the risk of type 2 diabetes, heart disease, colon cancer, and stroke. Health benefits also include weight loss and slowing kidney failure in patients with chronic kidney disease.  DIET GUIDELINES  Limit salt (sodium). Your diet should contain less than 1500 mg of sodium daily.  Limit refined or processed carbohydrates. Your diet should include mostly whole grains. Desserts and added sugars should be used sparingly.  Include small amounts of heart-healthy fats. These types of fats include nuts, oils, and tub margarine. Limit saturated and trans fats. These fats have been shown to be harmful in the body. CHOOSING FOODS  The following food groups are based on a 2000 calorie diet. See your Registered Dietitian for individual calorie needs. Grains and Grain Products (6 to 8 servings daily)  Eat More Often: Whole-wheat bread, brown rice, whole-grain or wheat pasta, quinoa, popcorn without added fat or salt (air popped).  Eat Less Often: White bread, white pasta, white rice, cornbread. Vegetables (4 to 5 servings daily)  Eat More Often: Fresh, frozen, and canned vegetables. Vegetables may be raw, steamed, roasted, or grilled with a minimal amount of fat.  Eat Less Often/Avoid: Creamed or fried vegetables. Vegetables in a cheese sauce. Fruit (4 to 5 servings daily)  Eat More Often: All fresh, canned (in natural juice), or frozen fruits. Dried fruits without added  sugar. One hundred percent fruit juice ( cup [237 mL] daily).  Eat Less Often: Dried fruits with added sugar. Canned fruit in light or heavy syrup. [employer], Fish, and Poultry (2 servings or less daily. One serving is 3 to 4 oz [85-114 g]).  Eat More Often: Ninety percent or leaner ground beef, tenderloin, sirloin. Round cuts of beef, chicken breast, [place] breast. All fish. Grill, bake, or broil your meat. Nothing should be fried.  Eat Less Often/Avoid: Fatty cuts of meat, [place], or chicken leg, thigh, or wing. Fried cuts of meat or fish. Dairy (2 to 3 servings)  Eat More Often: Low-fat or fat-free milk, low-fat plain or light yogurt, reduced-fat or part-skim cheese.  Eat Less Often/Avoid: Milk (whole, 2%).Whole milk yogurt. Full-fat cheeses. Nuts, Seeds, and Legumes (4 to 5 servings per week)  Eat More Often: All without added salt.  Eat Less Often/Avoid: Salted nuts and seeds, canned beans with added salt. Fats and Sweets (limited)  Eat More Often: Vegetable oils, tub margarines without trans fats, sugar-free gelatin. Mayonnaise and salad dressings.  Eat Less Often/Avoid: Coconut oils, palm oils, butter, stick margarine, cream, half and half, cookies, candy, pie. FOR MORE INFORMATION The Dash Diet Eating Plan: www.dashdiet.org Document Released: 02/26/2011 Document Revised: 06/01/2011 Document Reviewed: 02/26/2011 [facility] Patient Information 2014 [place], [place].

## 2012-11-24 NOTE — Progress Notes (Signed)
  Subjective:    Patient ID: Shirley Romero, female    DOB: January 20, 1971, 42 y.o.   MRN: [id number]  HPI  Shirley Romero is here today to discuss the conditions listed below:  1)  ADD: She has been off [employer] over the summer.  She is back to taking classes full time and feels that she needs to be back on it.  It helps her to concentrate both on her course work and her job.      2)  Sleep Disturbances:  She has been having sleeping issues.  She has been given Ambien in the past and would like to continue on it.     Review of Systems  Constitutional: Positive for unexpected weight change.  HENT: Negative.   Eyes: Negative.   Respiratory: Negative.   Cardiovascular: Negative.   Gastrointestinal: Negative.   Endocrine: Negative.   Genitourinary: Negative.   Musculoskeletal: Negative.   Allergic/Immunologic: Negative.   Neurological: Negative.   Psychiatric/Behavioral: Positive for sleep disturbance and decreased concentration.     Past Medical History  Diagnosis Date  . VF (ventricular fibrillation)     She only has episodes a couple of times per year.  She took Toprol for a very short time and could not tolerate it because of low BP.    Marland Kitchen History of migraine headaches   . Anxiety      Family History  Problem Relation Age of Onset  . Heart disease Mother   . Hyperlipidemia Mother   . Thyroid disease Mother   . Hyperlipidemia Father   . Hypertension Sister   . Stroke Maternal Grandmother   . Diabetes Maternal Grandmother   . Hypertension Maternal Grandmother   . Alzheimer's disease Maternal Grandmother     History   Social History Narrative   Marital Status: Married Recruitment consultant)    Children:  G3 P3   Pets: Dogs (3) Cat (1)    Living Situation: Lives with husband and children.   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     Tobacco Use/Exposure:  None    Alcohol Use:   Occasional    Drug Use:  None   Diet:  Regular   Exercise:  None   Hobbies: Reading       Objective:   Physical Exam  Vitals reviewed. Constitutional: She is oriented to person, place, and time. She appears well-developed and well-nourished. No distress.  Cardiovascular: Normal rate, regular rhythm and normal heart sounds.   Pulmonary/Chest: Effort normal and breath sounds normal.  Neurological: She is alert and oriented to person, place, and time.  Skin: Skin is warm and dry.  Psychiatric: She has a normal mood and affect. Her behavior is normal. Judgment and thought content normal.     Assessment & Plan:

## 2012-12-26 ENCOUNTER — Encounter: Payer: Self-pay | Admitting: Family Medicine

## 2013-01-21 ENCOUNTER — Encounter: Payer: Self-pay | Admitting: Family Medicine

## 2013-01-21 DIAGNOSIS — F411 Generalized anxiety disorder: Secondary | ICD-10-CM | POA: Insufficient documentation

## 2013-01-21 DIAGNOSIS — G4700 Insomnia, unspecified: Secondary | ICD-10-CM | POA: Insufficient documentation

## 2013-01-21 DIAGNOSIS — K120 Recurrent oral aphthae: Secondary | ICD-10-CM | POA: Insufficient documentation

## 2013-01-21 DIAGNOSIS — Z23 Encounter for immunization: Secondary | ICD-10-CM | POA: Insufficient documentation

## 2013-01-21 NOTE — Assessment & Plan Note (Signed)
The patient confirmed that they are not allergic to eggs and have never had a bad reaction with the flu shot in the past.  The vaccination was given without difficulty.   

## 2013-01-21 NOTE — Assessment & Plan Note (Signed)
She is to try the combination of Kenalog, Famvir and Duke's Magic Mouthwash.

## 2013-01-21 NOTE — Assessment & Plan Note (Signed)
She was given prescriptions for Sonata and Ambien and is to use sparingly.

## 2013-01-21 NOTE — Assessment & Plan Note (Signed)
Refilled her Celexa.

## 2013-01-21 NOTE — Assessment & Plan Note (Signed)
She was given prescriptions for Vyvanse.

## 2013-02-21 ENCOUNTER — Encounter (INDEPENDENT_AMBULATORY_CARE_PROVIDER_SITE_OTHER): Payer: Self-pay

## 2013-02-21 ENCOUNTER — Encounter: Payer: Self-pay | Admitting: Family Medicine

## 2013-02-21 ENCOUNTER — Ambulatory Visit (INDEPENDENT_AMBULATORY_CARE_PROVIDER_SITE_OTHER): Payer: Managed Care, Other (non HMO) | Admitting: Family Medicine

## 2013-02-21 VITALS — BP 125/80 | HR 80 | Resp 16 | Wt 145.0 lb

## 2013-02-21 DIAGNOSIS — R41840 Attention and concentration deficit: Secondary | ICD-10-CM

## 2013-02-21 DIAGNOSIS — G4700 Insomnia, unspecified: Secondary | ICD-10-CM

## 2013-02-21 MED ORDER — LISDEXAMFETAMINE DIMESYLATE 50 MG PO CAPS: 50.0000 mg | ORAL_CAPSULE | ORAL | Status: DC

## 2013-02-21 MED ORDER — ZALEPLON 10 MG PO CAPS: 10.0000 mg | ORAL_CAPSULE | Freq: Every day | ORAL | Status: DC

## 2013-02-21 NOTE — Progress Notes (Signed)
   Subjective:    Patient ID: Shirley Romero, female    DOB: [birth year], 42 y.o.   MRN: [id number]  HPI  Shirley Romero is here to get a refill on her Vyvanse and Sonata. She has been doing well on her current doses.  She takes the Vyvanse most days and takes the Sonata occasionally.  The Vyvanse decreases her appetite.  She has lost 21 lbs since changing to Vyvanse 3 months ago.    Review of Systems  Constitutional: Positive for unexpected weight change.  HENT: Negative.   Eyes: Negative.   Respiratory: Negative.   Cardiovascular: Negative.   Gastrointestinal: Negative.   Endocrine: Negative.   Genitourinary: Negative.   Musculoskeletal: Negative.   Skin: Negative.   Allergic/Immunologic: Negative.   Neurological: Negative.   Hematological: Negative.   Psychiatric/Behavioral: Negative.      Past Medical History, Past Surgical History, Family History, Social History and Medications were reviewed and update at this visit.        Objective:   Physical Exam  Constitutional: She appears well-nourished. No distress.  Cardiovascular: Normal rate, regular rhythm and normal heart sounds.   Pulmonary/Chest: Effort normal and breath sounds normal.  Neurological: She is alert.  Psychiatric: She has a normal mood and affect. Her behavior is normal. Judgment and thought content normal.      Assessment & Plan:   Shirley Romero was seen today for medication management.  Diagnoses and associated orders for this visit:  Attention or concentration deficit Comments: She is doing well on Vyvanse 50 mg and will remain on it.   - Discontinue: lisdexamfetamine (VYVANSE) 50 MG capsule; Take 1 capsule (50 mg total) by mouth every morning. - Discontinue: lisdexamfetamine (VYVANSE) 50 MG capsule; Take 1 capsule (50 mg total) by mouth every morning. - lisdexamfetamine (VYVANSE) 50 MG capsule; Take 1 capsule (50 mg total) by mouth every morning.  Insomnia Comments: She was given a refill for Sonata.    - zaleplon (SONATA) 10 MG capsule; Take 1 capsule (10 mg total) by mouth at bedtime. Take 1 capsule at night as needed for 4 hours of sleep

## 2013-06-14 ENCOUNTER — Ambulatory Visit (INDEPENDENT_AMBULATORY_CARE_PROVIDER_SITE_OTHER): Payer: Managed Care, Other (non HMO) | Admitting: Family Medicine

## 2013-06-14 ENCOUNTER — Encounter: Payer: Self-pay | Admitting: Family Medicine

## 2013-06-14 VITALS — BP 124/78 | HR 108 | Resp 16 | Wt 144.0 lb

## 2013-06-14 DIAGNOSIS — R41840 Attention and concentration deficit: Secondary | ICD-10-CM

## 2013-06-14 DIAGNOSIS — Z111 Encounter for screening for respiratory tuberculosis: Secondary | ICD-10-CM

## 2013-06-14 DIAGNOSIS — G4700 Insomnia, unspecified: Secondary | ICD-10-CM

## 2013-06-14 MED ORDER — ZOLPIDEM TARTRATE 10 MG PO TABS: 10.0000 mg | ORAL_TABLET | Freq: Every evening | ORAL | Status: DC | PRN

## 2013-06-14 MED ORDER — LISDEXAMFETAMINE DIMESYLATE 50 MG PO CAPS: 50.0000 mg | ORAL_CAPSULE | ORAL | Status: DC

## 2013-06-14 NOTE — Patient Instructions (Addendum)
1)  ADD- Remain on your current medication.  2)  Insomnia - Take Ambien occasionally.  3)  PPD - Call with result and send picture on Friday or Saturday.    Tuberculin Skin Test The PPD skin test is a method used to help with the diagnosis of a disease called tuberculosis (TB). HOW THE TEST IS DONE  The test site (usually the forearm) is cleansed. The PPD extract is then injected under the top layer of skin, causing a blister to form on the skin. The reaction will take 48 - 72 hours to develop. You must return to your health care provider within that time to have the area checked. This will determine whether you have had a significant reaction to the PPD test. A reaction is measured in millimeters of hard swelling (induration) at the site. PREPARATION FOR TEST  There is no special preparation for this test. People with a skin rash or other skin irritations on their arms may need to have the test performed at a different spot on the body. Tell your health care provider if you have ever had a positive PPD skin test. If so, you should not have a repeat PPD test. Tell your doctor if you have a medical condition or if you take certain drugs, such as steroids, that can affect your immune system. These situations may lead to inaccurate test results. NORMAL FINDINGS A negative reaction (no induration) or a level of hard swelling that falls below a certain cutoff may mean that a person has not been infected with the bacteria that cause TB. There are different cutoffs for children, people with HIV, and other risk groups. Unfortunately, this is not a perfect test, and up to 20% of people infected with tuberculosis may not have a reaction on the PPD skin test. In addition, certain conditions that affect the immune system (cancer, recent chemotherapy, late-stage AIDS) may cause a false-negative test result.  The reaction will take 48 - 72 hours to develop. You must return to your health care provider within  that time to have the area checked. Follow your caregiver's instructions as to where and when to report for this to be done. Ranges for normal findings may vary among different laboratories and hospitals. You should always check with your doctor after having lab work or other tests done to discuss the meaning of your test results and whether your values are considered within normal limits. WHAT ABNORMAL RESULTS MEAN  The results of the test depend on the size of the skin reaction and on the person being tested.  A small reaction (5 mm of hard swelling at the site) is considered to be positive in people who have HIV, who are taking steroid therapy, or who have been in close contact with a person who has active tuberculosis. Larger reactions (greater than or equal to 10 mm) are considered positive in people with diabetes or kidney failure, and in health care workers, among others. In people with no known risks for tuberculosis, a positive reaction requires 15 mm or more of hard swelling at the site. RISKS AND COMPLICATIONS There is a very small risk of severe redness and swelling of the arm in people who have had a previous positive PPD test and who have the test again. There also have been a few rare cases of this reaction in people who have not been tested before. CONSIDERATIONS  A positive skin test does not necessarily mean that a person has active tuberculosis. More  tests will be done to check whether active disease is present. Many people who were born outside the [place] may have had a vaccine called "BCG," which can lead to a false-positive test result. MEANING OF TEST  Your caregiver will go over the test results with you and discuss the importance and meaning of your results, as well as treatment options and the need for additional tests if necessary. OBTAINING THE TEST RESULTS It is your responsibility to obtain your test results. Ask the lab or department performing the test when and how  you will get your results. Document Released: 12/17/2004 Document Revised: 06/01/2011 Document Reviewed: 02/19/2008 [facility] Patient Information 2014 [place], [place].

## 2013-06-14 NOTE — Progress Notes (Signed)
Subjective:    Patient ID: Shirley Romero, female    DOB: 17-May-1970, 43 y.o.   MRN: [id number]  HPI  Shirley Romero is here today to discuss the conditions listed below:   1)  ADD - She is here today to get a refill on her ADD medication (Vyvanse 50 mg).  She says that the medication continues to help her concentrate at school and work.  She feels that this dosage is appropriate and she would like to continue on it.    2)  Sleep Disturbances - She needs a refill on her Ambien.  3)  PPD - She needs a PPD test placed which is being required by her school.     Review of Systems  Constitutional: Positive for appetite change. Negative for fatigue and unexpected weight change.  Cardiovascular: Negative for chest pain and palpitations.  Neurological: Negative for dizziness and headaches.  Psychiatric/Behavioral: Positive for decreased concentration. Negative for behavioral problems, sleep disturbance, dysphoric mood and agitation. The patient is not nervous/anxious and is not hyperactive.      Past Medical History  Diagnosis Date  . VF (ventricular fibrillation)     She only has episodes a couple of times per year.  She took Toprol for a very short time and could not tolerate it because of low BP.    Marland Kitchen History of migraine headaches   . Anxiety   . ADD (attention deficit disorder)      Past Surgical History  Procedure Laterality Date  . Abdominal hysterectomy  2006    Endometriois, Adenomyosis, Ovarian Cysts  . Knee surgey Bilateral     orthoscopic  . Finger surgery Right     index finger -reconstructive surgery      History   Social History Narrative   Marital Status: Married Scientist, forensic)    Children:  G3 P3   Pets: Dogs (3) Cat (1)    Living Situation: Lives with husband and children.   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     Tobacco Use/Exposure:  None    Alcohol  Use:  Occasional    Drug Use:  None   Diet:  Regular   Exercise:  None   Hobbies: Reading      Family History  Problem Relation Age of Onset  . Heart disease Mother   . Hyperlipidemia Mother   . Thyroid disease Mother   . Hyperlipidemia Father   . GER disease Father   . Sleep apnea Father   . Hypertension Sister   . Stroke Maternal Grandmother   . Diabetes Maternal Grandmother   . Hypertension Maternal Grandmother   . Alzheimer's disease Maternal Grandmother      Current Outpatient Prescriptions on File Prior to Visit  Medication Sig Dispense Refill  . citalopram (CELEXA) 40 MG tablet Take 1 tablet (40 mg total) by mouth at bedtime.  90 tablet  0  . famciclovir (FAMVIR) 500 MG tablet Take 1/2 - 1 tab daily for prevention or 3 tabs at onset of symptoms for treatment  30 tablet  11  . triamcinolone (KENALOG) 0.1 % paste Place onto teeth 2 (two) times daily. Apply to canker sores up to TID  5 g  11  . zaleplon (SONATA) 10 MG capsule Take 1 capsule (10 mg total) by mouth at bedtime. Take 1 capsule at night as needed for 4 hours of sleep  30 capsule  0   No current facility-administered medications on file prior to visit.     Allergies  Allergen Reactions  . Flagyl [Metronidazole] Nausea And Vomiting     Immunization History  Administered Date(s) Administered  . Influenza,inj,Quad PF,36+ Mos 11/24/2012  . PPD Test 06/14/2013  . Tdap 03/23/2009      Objective:   Physical Exam  Vitals reviewed. Constitutional: She is oriented to person, place, and time.  Eyes: Conjunctivae are normal. No scleral icterus.  Neck: Neck supple. No thyromegaly present.  Cardiovascular: Normal rate, regular rhythm and normal heart sounds.   Pulmonary/Chest: Effort normal and breath sounds normal.  Musculoskeletal: She exhibits no edema and no tenderness.  Neurological: She is alert and oriented to person, place, and time.  Skin: Skin is warm and dry.  Psychiatric: She has a normal mood  and affect. Her behavior is normal. Judgment and thought content normal.      Assessment & Plan:    Shirley Romero was seen today for medication management.  Diagnoses and associated orders for this visit:  Attention or concentration deficit Comments: She is doing well on Vyvanse 50 mg and will remain on it.   - lisdexamfetamine (VYVANSE) 50 MG capsule; Take 1 capsule (50 mg total) by mouth every morning. - lisdexamfetamine (VYVANSE) 50 MG capsule; Take 1 capsule (50 mg total) by mouth every morning. - lisdexamfetamine (VYVANSE) 50 MG capsule; Take 1 capsule (50 mg total) by mouth every morning.  Insomnia Comments: Refilled her Ambien which she takes occasionally.   - zolpidem (AMBIEN) 10 MG tablet; Take 1 tablet (10 mg total) by mouth at bedtime as needed for sleep (8 hours of sleep).  Screening examination for pulmonary tuberculosis Comments: PPD was placed.  Her mom Laneta Simmers) will read it for me and she'll send me a picture for confirmation of the result.   - PPD

## 2013-08-22 ENCOUNTER — Ambulatory Visit (INDEPENDENT_AMBULATORY_CARE_PROVIDER_SITE_OTHER): Payer: Managed Care, Other (non HMO) | Admitting: Family Medicine

## 2013-08-22 ENCOUNTER — Encounter: Payer: Self-pay | Admitting: Family Medicine

## 2013-08-22 VITALS — BP 143/63 | HR 101 | Resp 16 | Ht 65.5 in | Wt 143.0 lb

## 2013-08-22 DIAGNOSIS — G4700 Insomnia, unspecified: Secondary | ICD-10-CM

## 2013-08-22 DIAGNOSIS — R41840 Attention and concentration deficit: Secondary | ICD-10-CM

## 2013-08-22 DIAGNOSIS — F411 Generalized anxiety disorder: Secondary | ICD-10-CM

## 2013-08-22 MED ORDER — CITALOPRAM HYDROBROMIDE 40 MG PO TABS
40.0000 mg | ORAL_TABLET | Freq: Every day | ORAL | Status: DC
Start: 2013-08-22 — End: 2013-10-27

## 2013-08-22 MED ORDER — CITALOPRAM HYDROBROMIDE 40 MG PO TABS: 40.0000 mg | ORAL_TABLET | Freq: Every day | ORAL | Status: DC

## 2013-08-22 MED ORDER — ZOLPIDEM TARTRATE 10 MG PO TABS: 10.0000 mg | ORAL_TABLET | Freq: Every evening | ORAL | Status: DC | PRN

## 2013-08-22 MED ORDER — LISDEXAMFETAMINE DIMESYLATE 50 MG PO CAPS: 50.0000 mg | ORAL_CAPSULE | ORAL | Status: DC

## 2013-08-22 MED ORDER — LISDEXAMFETAMINE DIMESYLATE 50 MG PO CAPS
50.0000 mg | ORAL_CAPSULE | ORAL | Status: DC
Start: 2013-08-22 — End: 2013-08-22

## 2013-08-22 MED ORDER — ZALEPLON 10 MG PO CAPS: 10.0000 mg | ORAL_CAPSULE | Freq: Every day | ORAL | Status: DC

## 2013-08-22 NOTE — Progress Notes (Signed)
Subjective:    Patient ID: Shirley Romero, female    DOB: 01-Mar-1971, 43 y.o.   MRN: [id number]  HPI   Shirley Romero is here today needing to get several medications refilled.  1)  ADD:  She is doing well on her current dose of Vyvanse (50 mg).  2)  Mood:  She is doing well on the Celexa 40 mg. She needs refills  3)  Sleep Disturbance:  She uses Ambien and Sonata as needed and would like to get refills just in case she needs them.      Review of Systems  Constitutional: Negative for activity change, appetite change and fatigue.  Cardiovascular: Negative for chest pain, palpitations and leg swelling.  Psychiatric/Behavioral: Negative for behavioral problems, sleep disturbance and decreased concentration. The patient is not nervous/anxious.   All other systems reviewed and are negative.    Past Medical History  Diagnosis Date  . VF (ventricular fibrillation)     She only has episodes a couple of times per year.  She took Toprol for a very short time and could not tolerate it because of low BP.    Marland Kitchen History of migraine headaches   . Anxiety   . ADD (attention deficit disorder)      Past Surgical History  Procedure Laterality Date  . Abdominal hysterectomy  2006    Endometriois, Adenomyosis, Ovarian Cysts  . Knee surgey Bilateral     orthoscopic  . Finger surgery Right     index finger -reconstructive surgery      History   Social History Narrative   Marital Status: Married Scientist, forensic)    Children:  G3 P3   Pets: Dogs (3) Cat (1)    Living Situation: Lives with husband and children.   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     Tobacco Use/Exposure:  None    Alcohol Use:  Occasional    Drug Use:  None   Diet:  Regular   Exercise:  None   Hobbies: Reading      Family History  Problem Relation Age of Onset  . Heart disease Mother   . Hyperlipidemia Mother   .  Thyroid disease Mother   . Hyperlipidemia Father   . GER disease Father   . Sleep apnea Father   . Hypertension Sister   . Stroke Maternal Grandmother   . Diabetes Maternal Grandmother   . Hypertension Maternal Grandmother   . Alzheimer's disease Maternal Grandmother      Current Outpatient Prescriptions on File Prior to Visit  Medication Sig Dispense Refill  . triamcinolone (KENALOG) 0.1 % paste Place onto teeth 2 (two) times daily. Apply to canker sores up to TID  5 g  11   No current facility-administered medications on file prior to visit.     Allergies  Allergen Reactions  . Flagyl [Metronidazole] Nausea And Vomiting     Immunization History  Administered Date(s) Administered  . Influenza,inj,Quad PF,36+ Mos 11/24/2012  . PPD Test 06/14/2013  . Tdap 03/23/2009       Objective:   Physical Exam  Constitutional: She appears well-nourished. No distress.  Cardiovascular: Normal rate, regular rhythm and normal heart sounds.   Pulmonary/Chest: Effort normal and breath sounds normal.  Neurological: She is alert.  Psychiatric: She has a normal mood and affect. Her behavior is normal. Judgment and thought content normal.  Assessment & Plan:    Shirley Romero was seen today for medication management.  Diagnoses and associated orders for this visit:  Anxiety state, unspecified Comments: Refilled her Celexa.  - citalopram (CELEXA) 40 MG tablet; Take 1 tablet (40 mg total) by mouth at bedtime.  Insomnia Comments: Refilled her Ambien and Sonata which she takes occasionally.   - zolpidem (AMBIEN) 10 MG tablet; Take 1 tablet (10 mg total) by mouth at bedtime as needed for sleep (8 hours of sleep). - zaleplon (SONATA) 10 MG capsule; Take 1 capsule (10 mg total) by mouth at bedtime. Take 1 capsule at night as needed for 4 hours of sleep  Attention or concentration deficit Comments: She is doing well on Vyvanse 50 mg and will remain on it.    - Discontinue: lisdexamfetamine  (VYVANSE) 50 MG capsule; Take 1 capsule (50 mg total) by mouth every morning.  Refilled x 3 months.    TIME SPENT "FACE TO FACE" WITH PATIENT -  30 MINS

## 2013-10-27 ENCOUNTER — Encounter: Payer: Self-pay | Admitting: Family Medicine

## 2013-10-27 ENCOUNTER — Ambulatory Visit (INDEPENDENT_AMBULATORY_CARE_PROVIDER_SITE_OTHER): Payer: Managed Care, Other (non HMO) | Admitting: Family Medicine

## 2013-10-27 VITALS — BP 106/79 | HR 86 | Resp 16 | Ht 65.5 in | Wt 139.0 lb

## 2013-10-27 DIAGNOSIS — K120 Recurrent oral aphthae: Secondary | ICD-10-CM

## 2013-10-27 DIAGNOSIS — F411 Generalized anxiety disorder: Secondary | ICD-10-CM

## 2013-10-27 DIAGNOSIS — R41840 Attention and concentration deficit: Secondary | ICD-10-CM

## 2013-10-27 MED ORDER — CITALOPRAM HYDROBROMIDE 40 MG PO TABS: 40.0000 mg | ORAL_TABLET | Freq: Every day | ORAL | Status: DC

## 2013-10-27 MED ORDER — LISDEXAMFETAMINE DIMESYLATE 50 MG PO CAPS: 50.0000 mg | ORAL_CAPSULE | ORAL | Status: AC

## 2013-10-27 MED ORDER — LISDEXAMFETAMINE DIMESYLATE 50 MG PO CAPS: 50.0000 mg | ORAL_CAPSULE | ORAL | Status: DC

## 2013-10-27 MED ORDER — FAMCICLOVIR 500 MG PO TABS: ORAL_TABLET | ORAL | Status: AC

## 2013-10-27 NOTE — Progress Notes (Signed)
Subjective:    Patient ID: Shirley Romero, female    DOB: 1970/06/07, 43 y.o.   MRN: [id number]  HPI  Shirley Romero is here today to follow up on her medications. She is needing to get some refills on the Vyvanse and Celexa.  She continues to do well on her current dose on both medications. She is eating and sleeping well.   Review of Systems  Constitutional: Negative for activity change, appetite change and fatigue.  Cardiovascular: Negative for chest pain, palpitations and leg swelling.  Psychiatric/Behavioral: Negative for behavioral problems and decreased concentration. The patient is not nervous/anxious.   All other systems reviewed and are negative.    Past Medical History  Diagnosis Date  . VF (ventricular fibrillation)     She only has episodes a couple of times per year.  She took Toprol for a very short time and could not tolerate it because of low BP.    Marland Kitchen History of migraine headaches   . Anxiety   . ADD (attention deficit disorder)      Past Surgical History  Procedure Laterality Date  . Abdominal hysterectomy  2006    Endometriois, Adenomyosis, Ovarian Cysts  . Knee surgey Bilateral     orthoscopic  . Finger surgery Right     index finger -reconstructive surgery      History   Social History Narrative   Marital Status: Married Scientist, forensic)    Children:  G3 P3   Pets: Dogs (3) Cat (1)    Living Situation: Lives with husband and children.   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     Tobacco Use/Exposure:  None    Alcohol Use:  Occasional    Drug Use:  None   Diet:  Regular   Exercise:  None   Hobbies: Reading      Family History  Problem Relation Age of Onset  . Heart disease Mother   . Hyperlipidemia Mother   . Thyroid disease Mother   . Hyperlipidemia Father   . GER disease Father   . Sleep apnea Father   . Hypertension Sister   . Stroke  Maternal Grandmother   . Diabetes Maternal Grandmother   . Hypertension Maternal Grandmother   . Alzheimer's disease Maternal Grandmother      Current Outpatient Prescriptions on File Prior to Visit  Medication Sig Dispense Refill  . triamcinolone (KENALOG) 0.1 % paste Place onto teeth 2 (two) times daily. Apply to canker sores up to TID  5 g  11  . zaleplon (SONATA) 10 MG capsule Take 1 capsule (10 mg total) by mouth at bedtime. Take 1 capsule at night as needed for 4 hours of sleep  30 capsule  0  . zolpidem (AMBIEN) 10 MG tablet Take 1 tablet (10 mg total) by mouth at bedtime as needed for sleep (8 hours of sleep).  30 tablet  0   No current facility-administered medications on file prior to visit.     Allergies  Allergen Reactions  . Flagyl [Metronidazole] Nausea And Vomiting     Immunization History  Administered Date(s) Administered  . Influenza,inj,Quad PF,36+ Mos 11/24/2012  . PPD Test 06/14/2013  . Tdap 03/23/2009       Objective:   Physical Exam  Constitutional: She appears well-nourished. No distress.  Cardiovascular: Normal rate, regular rhythm and normal heart sounds.   Pulmonary/Chest: Effort normal and  breath sounds normal.  Neurological: She is alert.  Psychiatric: She has a normal mood and affect. Her behavior is normal. Judgment and thought content normal.      Assessment & Plan:    Shirley Romero was seen today for medication management.  Diagnoses and associated orders for this visit:  Anxiety state, unspecified - citalopram (CELEXA) 40 MG tablet; Take 1 tablet (40 mg total) by mouth at bedtime.  Attention or concentration deficit Comments: She is doing well on Vyvanse 50 mg and will remain on it.   - lisdexamfetamine (VYVANSE) 50 MG capsule; Take 1 capsule (50 mg total) by mouth every morning.  Canker sores oral - famciclovir (FAMVIR) 500 MG tablet; Take 1/2 - 1 tab daily for prevention or 3 tabs at onset of symptoms for treatment  TIME SPENT  "FACE TO FACE" WITH PATIENT -  20 MINS

## 2017-05-03 DIAGNOSIS — F988 Other specified behavioral and emotional disorders with onset usually occurring in childhood and adolescence: Secondary | ICD-10-CM | POA: Insufficient documentation

## 2017-05-03 DIAGNOSIS — J309 Allergic rhinitis, unspecified: Secondary | ICD-10-CM | POA: Insufficient documentation

## 2019-05-09 ENCOUNTER — Encounter: Payer: Self-pay | Admitting: Internal Medicine

## 2019-06-12 ENCOUNTER — Encounter: Payer: Managed Care, Other (non HMO) | Admitting: Internal Medicine

## 2019-06-27 ENCOUNTER — Telehealth: Payer: Self-pay | Admitting: *Deleted

## 2019-06-27 NOTE — Telephone Encounter (Signed)
Pt NS her PV  Today - called pt and LM to return call by 5 pm today or her PV and Colon would be canceled   No call or RS by 5 pm - canceled PV and Colon 4-20  Mailed NS letter

## 2019-07-11 ENCOUNTER — Encounter: Payer: Managed Care, Other (non HMO) | Admitting: Internal Medicine

## 2019-07-22 DIAGNOSIS — Z86010 Personal history of colonic polyps: Secondary | ICD-10-CM

## 2019-07-22 DIAGNOSIS — Z860101 Personal history of adenomatous and serrated colon polyps: Secondary | ICD-10-CM

## 2019-07-22 HISTORY — DX: Personal history of adenomatous and serrated colon polyps: Z86.0101

## 2019-07-22 HISTORY — DX: Personal history of colonic polyps: Z86.010

## 2019-07-26 ENCOUNTER — Ambulatory Visit (AMBULATORY_SURGERY_CENTER): Payer: Managed Care, Other (non HMO) | Admitting: *Deleted

## 2019-07-26 ENCOUNTER — Other Ambulatory Visit: Payer: Self-pay

## 2019-07-26 VITALS — Ht 66.0 in | Wt 149.0 lb

## 2019-07-26 DIAGNOSIS — Z1211 Encounter for screening for malignant neoplasm of colon: Secondary | ICD-10-CM

## 2019-07-26 NOTE — Progress Notes (Signed)
2nd dose of covid vaccine 03-30-19  Pt's previsit is done over the phone and all paperwork (prep instructions, blank consent form to just read over, pre-procedure acknowledgement form and stamped envelope) sent to patient  Pt is aware that care partner will wait in the car during procedure; if they feel like they will be too hot or cold to wait in the car; they may wait in the 4 th floor lobby. Patient is aware to bring only one care partner. We want them to wear a mask (we do not have any that we can provide them), practice social distancing, and we will check their temperatures when they get here.  I did remind the patient that their care partner needs to stay in the parking lot the entire time and have a cell phone available, we will call them when the pt is ready for discharge. Patient will wear mask into building.   Pt states she has had no issues with Propofol General anesthesia with PONV, difficulty with intubation or hx/fam hx of malignant hyperthermia per pt   No egg or soy allergy  No home oxygen use   No medications for weight loss taken  emmi information given  Pt denies constipation issues

## 2019-08-03 ENCOUNTER — Encounter: Payer: Self-pay | Admitting: Internal Medicine

## 2019-08-09 ENCOUNTER — Ambulatory Visit (AMBULATORY_SURGERY_CENTER): Payer: BLUE CROSS/BLUE SHIELD | Admitting: Internal Medicine

## 2019-08-09 ENCOUNTER — Other Ambulatory Visit: Payer: Self-pay

## 2019-08-09 ENCOUNTER — Encounter: Payer: Self-pay | Admitting: Internal Medicine

## 2019-08-09 VITALS — BP 114/74 | HR 63 | Temp 96.8°F | Resp 11 | Ht 66.0 in | Wt 149.0 lb

## 2019-08-09 DIAGNOSIS — D124 Benign neoplasm of descending colon: Secondary | ICD-10-CM

## 2019-08-09 DIAGNOSIS — D123 Benign neoplasm of transverse colon: Secondary | ICD-10-CM

## 2019-08-09 DIAGNOSIS — D125 Benign neoplasm of sigmoid colon: Secondary | ICD-10-CM

## 2019-08-09 DIAGNOSIS — Z1211 Encounter for screening for malignant neoplasm of colon: Secondary | ICD-10-CM | POA: Diagnosis present

## 2019-08-09 HISTORY — PX: COLONOSCOPY: SHX174

## 2019-08-09 MED ORDER — SODIUM CHLORIDE 0.9 % IV SOLN
500.0000 mL | Freq: Once | INTRAVENOUS | Status: DC
Start: 2019-08-09 — End: 2020-05-06

## 2019-08-09 NOTE — Progress Notes (Signed)
A/ox3, pleased with MAC, report to RN 

## 2019-08-09 NOTE — Op Note (Signed)
[facility] Patient Name: Shirley Romero Procedure Date: 08/09/2019 8:35 AM MRN: [id number] Endoscopist: Gatha Mayer , MD Age: 49 Referring MD:  Date of Birth: 07-05-1970 Gender: Female Account #: [id number] Procedure:                Colonoscopy Indications:              Screening for colorectal malignant neoplasm, This                            is the patient's first colonoscopy Medicines:                Propofol per Anesthesia, Monitored Anesthesia Care Procedure:                Pre-Anesthesia Assessment:                           - Prior to the procedure, a History and Physical                            was performed, and patient medications and                            allergies were reviewed. The patient's tolerance of                            previous anesthesia was also reviewed. The risks                            and benefits of the procedure and the sedation                            options and risks were discussed with the patient.                            All questions were answered, and informed consent                            was obtained. Prior Anticoagulants: The patient has                            taken no previous anticoagulant or antiplatelet                            agents. ASA Grade Assessment: II - A patient with                            mild systemic disease. After reviewing the risks                            and benefits, the patient was deemed in                            satisfactory condition to undergo the procedure.  After obtaining informed consent, the colonoscope                            was passed under direct vision. Throughout the                            procedure, the patient's blood pressure, pulse, and                            oxygen saturations were monitored continuously. The                            Colonoscope was introduced through the anus and   advanced to the the cecum, identified by                            appendiceal orifice and ileocecal valve. The                            colonoscopy was performed without difficulty. The                            patient tolerated the procedure well. The quality                            of the bowel preparation was excellent. The bowel                            preparation used was Miralax via split dose                            instruction. The ileocecal valve, appendiceal                            orifice, and rectum were photographed. Scope In: 8:51:32 AM Scope Out: 9:12:20 AM Scope Withdrawal Time: 0 hours 16 minutes 18 seconds  Total Procedure Duration: 0 hours 20 minutes 48 seconds  Findings:                 The perianal and digital rectal examinations were                            normal.                           Three sessile polyps were found in the sigmoid                            colon, descending colon and transverse colon. The                            polyps were 3 to 10 mm in size. These polyps were                            removed with a cold  snare. Resection and retrieval                            were complete. Verification of patient                            identification for the specimen was done. Estimated                            blood loss was minimal.                           The exam was otherwise without abnormality on                            direct and retroflexion views. Complications:            No immediate complications. Estimated Blood Loss:     Estimated blood loss was minimal. Impression:               - Three 3 to 10 mm polyps in the sigmoid colon, in                            the descending colon and in the transverse colon,                            removed with a cold snare. Resected and retrieved.                           - The examination was otherwise normal on direct                            and retroflexion  views. Recommendation:           - Patient has a contact number available for                            emergencies. The signs and symptoms of potential                            delayed complications were discussed with the                            patient. Return to normal activities tomorrow.                            Written discharge instructions were provided to the                            patient.                           - Resume previous diet.                           - Continue present medications.                           -  Repeat colonoscopy is recommended. The                            colonoscopy date will be determined after pathology                            results from today's exam become available for                            review. Gatha Mayer, MD 08/09/2019 9:21:10 AM This report has been signed electronically.

## 2019-08-09 NOTE — Patient Instructions (Addendum)
I found and removed 3 polyps - all look benign, might be pre-cancerous.  I will let you know pathology results and when to have another routine colonoscopy by mail and/or My Chart.   I appreciate the opportunity to care for you. Gatha Mayer, MD, FACG YOU HAD AN ENDOSCOPIC PROCEDURE TODAY AT [facility]:   Refer to the procedure report that was given to you for any specific questions about what was found during the examination.  If the procedure report does not answer your questions, please call your gastroenterologist to clarify.  If you requested that your care partner not be given the details of your procedure findings, then the procedure report has been included in a sealed envelope for you to review at your convenience later.  YOU SHOULD EXPECT: Some feelings of bloating in the abdomen. Passage of more gas than usual.  Walking can help get rid of the air that was put into your GI tract during the procedure and reduce the bloating. If you had a lower endoscopy (such as a colonoscopy or flexible sigmoidoscopy) you may notice spotting of blood in your stool or on the toilet paper. If you underwent a bowel prep for your procedure, you may not have a normal bowel movement for a few days.  Please Note:  You might notice some irritation and congestion in your nose or some drainage.  This is from the oxygen used during your procedure.  There is no need for concern and it should clear up in a day or so.  SYMPTOMS TO REPORT IMMEDIATELY:   Following lower endoscopy (colonoscopy or flexible sigmoidoscopy):  Excessive amounts of blood in the stool  Significant tenderness or worsening of abdominal pains  Swelling of the abdomen that is new, acute  Fever of 100F or higher   For urgent or emergent issues, a gastroenterologist can be reached at any hour by calling [phone]. Do not use MyChart messaging for urgent concerns.    DIET:  We do recommend a small meal at first, but  then you may proceed to your regular diet.  Drink plenty of fluids but you should avoid alcoholic beverages for 24 hours.  MEDICATIONS: Continue present medications.  Please see handouts given to you by your recovery nurse.  ACTIVITY:  You should plan to take it easy for the rest of today and you should NOT DRIVE or use heavy machinery until tomorrow (because of the sedation medicines used during the test).    FOLLOW UP: Our staff will call the number listed on your records 48-72 hours following your procedure to check on you and address any questions or concerns that you may have regarding the information given to you following your procedure. If we do not reach you, we will leave a message.  We will attempt to reach you two times.  During this call, we will ask if you have developed any symptoms of COVID 19. If you develop any symptoms (ie: fever, flu-like symptoms, shortness of breath, cough etc.) before then, please call [phone].  If you test positive for Covid 19 in the 2 weeks post procedure, please call and report this information to [place].    If any biopsies were taken you will be contacted by phone or by letter within the next 1-3 weeks.  Please call us at [phone] if you have not heard about the biopsies in 3 weeks.   Thank you for allowing [place] to provide for your healthcare needs today.  SIGNATURES/CONFIDENTIALITY: You and/or your care partner have signed paperwork which will be entered into your electronic medical record.  These signatures attest to the fact that that the information above on your After Visit Summary has been reviewed and is understood.  Full responsibility of the confidentiality of this discharge information lies with you and/or your care-partner.

## 2019-08-09 NOTE — Progress Notes (Signed)
VS- Nash Mantis Temp- June Bullock  Pt denies any changes to medical record since office visit.  Called to room to assist during endoscopic procedure.  Patient ID and intended procedure confirmed with present staff. Received instructions for my participation in the procedure from the performing physician.

## 2019-08-11 ENCOUNTER — Telehealth: Payer: Self-pay | Admitting: *Deleted

## 2019-08-11 NOTE — Telephone Encounter (Signed)
Message left

## 2019-08-17 ENCOUNTER — Encounter: Payer: Self-pay | Admitting: Internal Medicine

## 2020-05-06 ENCOUNTER — Encounter: Payer: Self-pay | Admitting: Internal Medicine

## 2020-05-06 ENCOUNTER — Other Ambulatory Visit: Payer: Self-pay

## 2020-05-06 ENCOUNTER — Ambulatory Visit (INDEPENDENT_AMBULATORY_CARE_PROVIDER_SITE_OTHER): Payer: BLUE CROSS/BLUE SHIELD | Admitting: Internal Medicine

## 2020-05-06 VITALS — BP 102/70 | HR 77 | Ht 66.0 in | Wt 143.8 lb

## 2020-05-06 DIAGNOSIS — K600 Acute anal fissure: Secondary | ICD-10-CM

## 2020-05-06 DIAGNOSIS — K9041 Non-celiac gluten sensitivity: Secondary | ICD-10-CM | POA: Diagnosis not present

## 2020-05-06 DIAGNOSIS — L309 Dermatitis, unspecified: Secondary | ICD-10-CM | POA: Diagnosis not present

## 2020-05-06 MED ORDER — AMBULATORY NON FORMULARY MEDICATION: 3 refills | Status: DC

## 2020-05-06 MED ORDER — NYSTATIN-TRIAMCINOLONE 100000-0.1 UNIT/GM-% EX OINT: 1.0000 "application " | TOPICAL_OINTMENT | Freq: Two times a day (BID) | CUTANEOUS | 1 refills | Status: DC

## 2020-05-06 NOTE — Patient Instructions (Addendum)
   Skin trauma can make your problems worse. Avoid wiping too much. Wet wipes will cause less irritation. Using a hair dryer on low to dry perianal skin after bathing will help.   We have sent the following medications to your pharmacy for you to pick up at your convenience: Nystatin and also Diltiazem/Lidocaine mixture   Your provider has prescribed Diltiazem/Lidocaine gel for you. Please follow the directions written on your prescription bottle or given to you specifically by your provider. Since this is a specialty medication and is not readily available at most local pharmacies, we have sent your prescription to:  [facility] information is below: Address: [address], [place], [place] [address]  [phone]  *Please DO NOT go directly from our office to pick up this medication! Give the pharmacy 1 day to process the prescription as this is compounded and takes time to make.   Follow up in 2 months please.   I appreciate the opportunity to care for you. Silvano Rusk, MD, [facility]

## 2020-05-06 NOTE — Progress Notes (Signed)
[facility] Irizarry 50 y.o. 06/30/1970 [id number]  Assessment & Plan:   Encounter Diagnoses  Name Primary?  . Acute posterior anal fissure Yes  . Perianal dermatitis   . 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  Treatment as below. Advice as below also given: Skin trauma can make your problems worse. Avoid wiping too much. Wet wipes will cause less irritation. Using a hair dryer on low to dry perianal skin after bathing will help.  Return in 2 months  At that point if she is significantly better I would consider a gluten challenge and testing for celiac disease  Meds ordered this encounter  Medications  . nystatin-triamcinolone ointment (MYCOLOG)    Sig: Apply 1 application topically 2 (two) times daily.    Dispense:  30 g    Refill:  1  . AMBULATORY NON FORMULARY MEDICATION    Sig: Medication Name: Dilitiazem 2% mixed with Lidocaine 5%  Sig: apply a pea size amount to rectum three times daily    Dispense:  30 g    Refill:  3        Subjective:   Chief Complaint:  HPI Shirley Romero is a 50 year old white woman with a history of colon polyps on colonoscopy in May 2021, one adenoma and 2 hyperplastic polyps with a 10 mm adenoma removed, who has been having problems with loose stools diarrhea over time, did not really discuss that with me at the time of her colonoscopy which was for screening.  She elected to eliminate or at least severely reduced gluten and that has helped with diarrhea.  More recently she has developed problems with painful rectal spasms particularly triggered after defecation, and irritation in the anal area.  Itching, clear mucus discharge as well.  There is some bleeding at times.  She has tried over-the-counter hemorrhoidal care but has provided some benefit but not enough.  She is not straining to stool.  Rectal and anal spasms/pain can be quite severe.  She has not noted anything  protruding.     Allergies  Allergen Reactions  . Flagyl [Metronidazole] Nausea And Vomiting   Current Meds  Medication Sig  . AMBULATORY NON FORMULARY MEDICATION Medication Name: Dilitiazem 2% mixed with Lidocaine 5%  Sig: apply a pea size amount to rectum three times daily  . azelastine (OPTIVAR) 0.05 % ophthalmic solution as needed.  . citalopram (CELEXA) 10 MG tablet Take 10 mg by mouth daily.  Marland Kitchen levocetirizine (XYZAL) 5 MG tablet Take 5 mg by mouth daily.  Marland Kitchen lisdexamfetamine (VYVANSE) 50 MG capsule Take 1 capsule (50 mg total) by mouth every morning.  Marland Kitchen MAGNESIUM PO Take by mouth as needed.  . montelukast (SINGULAIR) 10 MG tablet Take 10 mg by mouth daily.  Marland Kitchen nystatin-triamcinolone ointment (MYCOLOG) Apply 1 application topically 2 (two) times daily.  . zaleplon (SONATA) 5 MG capsule Take 5 mg by mouth as needed for sleep.  Marland Kitchen zolpidem (AMBIEN) 5 MG tablet Take 5 mg by mouth as needed for sleep.   Past Medical History:  Diagnosis Date  . ADD (attention deficit disorder)   . Allergy   . Anxiety   . Depression   . History of migraine headaches   . Hx of adenomatous polyp of colon 07/2019  . VF (ventricular fibrillation) ([place])    She only has episodes a couple of times per year.  She took Toprol for a very short time and could not tolerate it because  of low BP.     Past Surgical History:  Procedure Laterality Date  . ABDOMINAL HYSTERECTOMY  2006   Endometriois, Adenomyosis, Ovarian Cysts  . COLONOSCOPY  08/09/2019  . FINGER SURGERY Right    index finger -reconstructive surgery   . knee surgey Bilateral    orthoscopic   Social History   Social History Narrative   Marital Status: Married Scientist, forensic)    Children:  G3 P3   Pets: Dogs (3) Cat (1)    Living Situation: Lives with husband and children.   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     Tobacco Use/Exposure:  None    Alcohol Use:  Occasional     Drug Use:  None   Diet:  Regular   Exercise:  None   Hobbies: Reading    family history includes Alzheimer's disease in her maternal grandmother; Diabetes in her maternal grandmother; GER disease in her father; Heart disease in her mother; Hyperlipidemia in her father and mother; Hypertension in her maternal grandmother and sister; Sleep apnea in her father; Stroke in her maternal grandmother; Thyroid disease in her mother; Ulcerative colitis in her father.   Review of Systems As per HPI  Objective:   Physical Exam BP 102/70   Pulse 77   Ht 5\' 6"  (1.676 m)   Wt 143 lb 12.8 oz (65.2 kg)   SpO2 99%   BMI 23.21 kg/m   Aimee Eversole LPN present   Rectal  Perianal dermatitis (see photo)\      DRE tender posterior, slight induration  Anoscopy - posterior fissure, sl inflanmed Gr 1 ext hemorrhoids all positions

## 2020-07-02 ENCOUNTER — Ambulatory Visit: Payer: BLUE CROSS/BLUE SHIELD | Admitting: Internal Medicine

## 2022-08-05 ENCOUNTER — Encounter: Payer: Self-pay | Admitting: Internal Medicine

## 2022-08-05 ENCOUNTER — Telehealth: Payer: Self-pay | Admitting: Internal Medicine

## 2022-08-05 MED ORDER — AMBULATORY NON FORMULARY MEDICATION: 3 refills | Status: AC

## 2022-08-05 NOTE — Telephone Encounter (Signed)
Inbound call from pt regarding a refill for  Dilitiazem and  Lidocaine .Marland KitchenShe have a procedure coming up.Patient stated she is in a lot of pain.Marland KitchenPlease advise

## 2022-08-05 NOTE — Telephone Encounter (Signed)
Please send in 3 refills on the diltiazem and lidocaine.  I have messaged her via text and left her a voicemail.    I have an opening May 21 that she can use (1:50 PM) for a follow-up regarding anal fissure also please schedule that

## 2022-08-05 NOTE — Telephone Encounter (Signed)
Refilled her medicine per Dr Leone Payor and booked her the May 21st appointment.

## 2022-08-11 ENCOUNTER — Encounter: Payer: Self-pay | Admitting: Internal Medicine

## 2022-08-11 ENCOUNTER — Ambulatory Visit (INDEPENDENT_AMBULATORY_CARE_PROVIDER_SITE_OTHER): Payer: BC Managed Care – PPO | Admitting: Internal Medicine

## 2022-08-11 ENCOUNTER — Other Ambulatory Visit: Payer: BC Managed Care – PPO

## 2022-08-11 VITALS — BP 120/78 | HR 88 | Ht 66.0 in | Wt 164.0 lb

## 2022-08-11 DIAGNOSIS — R195 Other fecal abnormalities: Secondary | ICD-10-CM | POA: Diagnosis not present

## 2022-08-11 DIAGNOSIS — K600 Acute anal fissure: Secondary | ICD-10-CM | POA: Diagnosis not present

## 2022-08-11 DIAGNOSIS — Z86010 Personal history of colonic polyps: Secondary | ICD-10-CM | POA: Diagnosis not present

## 2022-08-11 NOTE — Progress Notes (Signed)
Shirley Romero Shirley Romero 52 y.o. 1970/09/04 [id number]  Assessment & Plan:   Encounter Diagnoses  Name Primary?   Acute anterior anal fissure Yes   Loose stools    Hx of adenomatous polyp of colon    Continue treatment with diltiazem lidocaine ointment.  She is improving.  Keep appointment for colonoscopy in late June.  Evaluate for possible celiac disease.  Consider random colon biopsies at the time of colonoscopy pending clinical course.  Orders Placed This Encounter  Procedures   Tissue transglutaminase, IgA   IgA    CC: Zanard, Hinton Dyer, MD    Subjective:   Chief Complaint: Anal fissure  HPI 52 year old white woman with a history of prior anal fissure, possible gluten sensitivity and adenomatous colon polyps in 2021 (2 hyperplastic as well, adenoma was 10 mm) who called into the office recently with increasing anal pain consistent with recurrent fissure.  Previously posterior fissure this 1 is anterior.  We called in diltiazem and lidocaine and scheduled follow-up and she is much improved at this time.  When seen in the past she raises question of gluten intolerance, she was gluten-free when I saw her before so we did not do lab testing.  She is now using gluten again and most days she has several very soft large volume stools a day.  Not watery diarrhea but very soft.  Sometimes formed stool maybe 1 or 2 days out of the week no constipation.  She has not had rectal bleeding. Allergies  Allergen Reactions   Flagyl [Metronidazole] Nausea And Vomiting   Current Meds  Medication Sig   AMBULATORY NON FORMULARY MEDICATION Medication Name: Dilitiazem 2% mixed with Lidocaine 5%  Sig: apply a pea size amount to rectum three times daily   atorvastatin (LIPITOR) 10 MG tablet Take 1 tablet by mouth at bedtime.   azelastine (OPTIVAR) 0.05 % ophthalmic solution as needed.   estradiol (CLIMARA - DOSED IN MG/24 HR) 0.025 mg/24hr patch Place onto the skin.   levocetirizine (XYZAL) 5 MG  tablet Take 5 mg by mouth daily.   lisdexamfetamine (VYVANSE) 50 MG capsule Take 1 capsule (50 mg total) by mouth every morning.   MAGNESIUM PO Take by mouth as needed.   zaleplon (SONATA) 5 MG capsule Take 5 mg by mouth as needed for sleep.   [DISCONTINUED] nystatin-triamcinolone ointment (MYCOLOG) Apply 1 application topically 2 (two) times daily.   Past Medical History:  Diagnosis Date   ADD (attention deficit disorder)    Allergy    Anal fissure    Anxiety    Depression    History of migraine headaches    Hx of adenomatous polyp of colon 07/2019   VF (ventricular fibrillation) ([facility])    She only has episodes a couple of times per year.  She took Toprol for a very short time and could not tolerate it because of low BP.     Past Surgical History:  Procedure Laterality Date   ABDOMINAL HYSTERECTOMY  2006   Endometriois, Adenomyosis, Ovarian Cysts   COLONOSCOPY  08/09/2019   FINGER SURGERY Right    index finger -reconstructive surgery    knee surgey Bilateral    orthoscopic   Social History   Social History Narrative   Marital Status: Married Recruitment consultant)    Children:  G3 P3   Pets: Dogs (3) Cat (1)    Living Situation: Lives with husband and children.   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     Tobacco Use/Exposure:  None    Alcohol Use:  Occasional    Drug Use:  None   Diet:  Regular   Exercise:  None   Hobbies: Reading    family history includes Alzheimer's disease in her maternal grandmother; Diabetes in her maternal grandmother; GER disease in her father; Heart disease in her mother; Hyperlipidemia in her father and mother; Hypertension in her maternal grandmother and sister; Sleep apnea in her father; Stroke in her maternal grandmother; Thyroid disease in her mother; Ulcerative colitis in her father.   Review of Systems As per HPI  Objective:   Physical Exam BP 120/78   Pulse 88   Ht 5\' 6"   (1.676 m)   Wt 164 lb (74.4 kg)   BMI 26.47 kg/m   NAD  Jovita Kussmaul, CMA present  Anal inspection small anterior sentiel pile and tender - c/w fissure

## 2022-08-11 NOTE — Patient Instructions (Signed)
Continue anal ointment for fissure - glad it is helping.  Please go to basement lab today to do celiac tests.  Keep upcoming appointments for colonoscopy.  I will call results of lab tests - I will be away next week so could be a delay.  I appreciate the opportunity to care for you. Iva Boop, MD, [facility]

## 2022-08-12 LAB — IGA: Immunoglobulin A: 189 mg/dL (ref 47–310)

## 2022-08-12 LAB — TISSUE TRANSGLUTAMINASE, IGA: (tTG) Ab, IgA: <1 U/mL

## 2022-08-26 ENCOUNTER — Ambulatory Visit: Payer: BC Managed Care – PPO

## 2022-08-26 ENCOUNTER — Encounter: Payer: Self-pay | Admitting: Internal Medicine

## 2022-08-26 ENCOUNTER — Telehealth: Payer: Self-pay

## 2022-08-26 NOTE — Telephone Encounter (Signed)
It appears the patient has called back and rescheduled her Pre Visit appt;

## 2022-08-26 NOTE — Telephone Encounter (Signed)
Multiple attempts made to reach patient; unable to speak with patient; Messages left for patient to call back to the office to reschedule PV appt; If patient fails to call back to the office prior to 5 pm or PV and procedure appts will be cancelled and a no show letter will be sent to the patient;

## 2022-08-28 ENCOUNTER — Telehealth: Payer: Self-pay

## 2022-08-28 NOTE — Telephone Encounter (Signed)
Called patient for pre visit and it went straight to voice mail.  Left message letting her know that I was trying to reach her.

## 2022-08-28 NOTE — Telephone Encounter (Signed)
Patient rescheduled pre visit to 09/04/22

## 2022-08-28 NOTE — Telephone Encounter (Signed)
Called back and left another voicemail.  Tried alternate contact number with no answer

## 2022-08-28 NOTE — Telephone Encounter (Signed)
Called alternate number and no answer.  Sent message in Mychart asking her to remove the block on her phone so that we can reach her.  Left message on mobile number as well.  Pre visit will need to be rescheduled.

## 2022-09-04 ENCOUNTER — Other Ambulatory Visit: Payer: Self-pay

## 2022-09-04 ENCOUNTER — Encounter: Payer: Self-pay | Admitting: Internal Medicine

## 2022-09-04 ENCOUNTER — Ambulatory Visit (AMBULATORY_SURGERY_CENTER): Payer: BC Managed Care – PPO

## 2022-09-04 VITALS — Ht 66.0 in | Wt 164.0 lb

## 2022-09-04 DIAGNOSIS — Z86010 Personal history of colonic polyps: Secondary | ICD-10-CM

## 2022-09-04 NOTE — Progress Notes (Signed)
Denies allergies to eggs or soy products. Denies complication of anesthesia or sedation. Denies use of weight loss medication. Denies use of O2.   Emmi instructions given for colonoscopy.  

## 2022-09-10 ENCOUNTER — Encounter: Payer: Self-pay | Admitting: Certified Registered Nurse Anesthetist

## 2022-09-16 ENCOUNTER — Ambulatory Visit (AMBULATORY_SURGERY_CENTER): Payer: BC Managed Care – PPO | Admitting: Internal Medicine

## 2022-09-16 ENCOUNTER — Encounter: Payer: Self-pay | Admitting: Internal Medicine

## 2022-09-16 VITALS — BP 100/55 | HR 75 | Temp 97.3°F | Resp 11 | Ht 66.0 in | Wt 164.0 lb

## 2022-09-16 DIAGNOSIS — Z09 Encounter for follow-up examination after completed treatment for conditions other than malignant neoplasm: Secondary | ICD-10-CM

## 2022-09-16 DIAGNOSIS — Z86010 Personal history of colonic polyps: Secondary | ICD-10-CM

## 2022-09-16 MED ORDER — SODIUM CHLORIDE 0.9 % IV SOLN
500.0000 mL | Freq: Once | INTRAVENOUS | Status: DC
Start: 2022-09-16 — End: 2022-09-16

## 2022-09-16 NOTE — Progress Notes (Signed)
[facility] History and Physical   Primary Care Physician:  Gillian Scarce, MD   Reason for Procedure:   Hx colon polyps  Plan:    colonoscopy     HPI: Shirley Romero is a 52 y.o. female for surveillance colonoscopy  10 mm adenoma removed 2021 Past Medical History:  Diagnosis Date   ADD (attention deficit disorder)    Allergy    Anal fissure    Anxiety    Depression    History of migraine headaches    Hx of adenomatous polyp of colon 07/2019   Hyperlipidemia    VF (ventricular fibrillation) ([facility])    She only has episodes a couple of times per year.  She took Toprol for a very short time and could not tolerate it because of low BP.      Past Surgical History:  Procedure Laterality Date   ABDOMINAL HYSTERECTOMY  2006   Endometriois, Adenomyosis, Ovarian Cysts   COLONOSCOPY  08/09/2019   FINGER SURGERY Right    index finger -reconstructive surgery    knee surgey Bilateral    orthoscopic    Prior to Admission medications   Medication Sig Start Date End Date Taking? Authorizing Provider  AMBULATORY NON FORMULARY MEDICATION Medication Name: Dilitiazem 2% mixed with Lidocaine 5%  Sig: apply a pea size amount to rectum three times daily 08/05/22  Yes Iva Boop, MD  atorvastatin (LIPITOR) 10 MG tablet Take 1 tablet by mouth at bedtime. 08/05/22 08/05/23 Yes [provider]  estradiol (CLIMARA - DOSED IN MG/24 HR) 0.025 mg/24hr patch Place onto the skin. 08/05/22 08/05/23 Yes [provider]  lisdexamfetamine (VYVANSE) 50 MG capsule Take 1 capsule (50 mg total) by mouth every morning. 10/27/13  Yes Zanard, Hinton Dyer, MD  MAGNESIUM PO Take by mouth as needed.   Yes [provider]  OVER THE COUNTER MEDICATION Vitammin D 3 one tablet daily.   Yes [provider]  OVER THE COUNTER MEDICATION Vitamin B 12  tablet one time daily.   Yes [provider]  azelastine (OPTIVAR) 0.05 % ophthalmic solution as needed. 04/24/19    [provider]  levocetirizine (XYZAL) 5 MG tablet Take 5 mg by mouth daily. 07/21/19   [provider]  zaleplon (SONATA) 5 MG capsule Take 5 mg by mouth as needed for sleep.    [provider]    Current Outpatient Medications  Medication Sig Dispense Refill   AMBULATORY NON FORMULARY MEDICATION Medication Name: Dilitiazem 2% mixed with Lidocaine 5%  Sig: apply a pea size amount to rectum three times daily 30 g 3   atorvastatin (LIPITOR) 10 MG tablet Take 1 tablet by mouth at bedtime.     estradiol (CLIMARA - DOSED IN MG/24 HR) 0.025 mg/24hr patch Place onto the skin.     lisdexamfetamine (VYVANSE) 50 MG capsule Take 1 capsule (50 mg total) by mouth every morning. 30 capsule 0   MAGNESIUM PO Take by mouth as needed.     OVER THE COUNTER MEDICATION Vitammin D 3 one tablet daily.     OVER THE COUNTER MEDICATION Vitamin B 12  tablet one time daily.     azelastine (OPTIVAR) 0.05 % ophthalmic solution as needed.     levocetirizine (XYZAL) 5 MG tablet Take 5 mg by mouth daily.     zaleplon (SONATA) 5 MG capsule Take 5 mg by mouth as needed for sleep.     Current Facility-Administered Medications  Medication Dose Route Frequency Provider  Last Rate Last Admin   0.9 %  sodium chloride infusion  500 mL Intravenous Once Iva Boop, MD        Allergies as of 09/16/2022 - Review Complete 09/16/2022  Allergen Reaction Noted   Flagyl [metronidazole] Nausea And Vomiting 11/24/2012    Family History  Problem Relation Age of Onset   Heart disease Mother    Hyperlipidemia Mother    Thyroid disease Mother    Hyperlipidemia Father    GER disease Father    Sleep apnea Father    Ulcerative colitis Father    Hypertension Sister    Stroke Maternal Grandmother    Diabetes Maternal Grandmother    Hypertension Maternal Grandmother    Alzheimer's disease Maternal Grandmother    Colon cancer Neg Hx    Esophageal cancer Neg Hx    Rectal cancer Neg Hx    Stomach cancer  Neg Hx    Liver disease Neg Hx     Social History   Socioeconomic History   Marital status: Married    Spouse name: Barbara Cower    Number of children: 3   Years of education: 16   Highest education level: Not on file  Occupational History   Occupation: NURSE (ER)     Employer: OTHER    Comment: [employer]   Tobacco Use   Smoking status: Never   Smokeless tobacco: Never  Vaping Use   Vaping Use: Never used  Substance and Sexual Activity   Alcohol use: Yes    Comment: Socially   Drug use: No   Sexual activity: Yes    Partners: Male    Birth control/protection: Surgical  Other Topics Concern   Not on file  Social History Narrative   Marital Status: Married Recruitment consultant)    Children:  G3 P3   Pets: Dogs (3) Cat (1)    Living Situation: Lives with husband and children.   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     Tobacco Use/Exposure:  None    Alcohol Use:  Occasional    Drug Use:  None   Diet:  Regular   Exercise:  None   Hobbies: Reading    Social Determinants of Health   Financial Resource Strain: Not on file  Food Insecurity: Not on file  Transportation Needs: Not on file  Physical Activity: Not on file  Stress: Not on file  Social Connections: Not on file  Intimate Partner Violence: Not on file    Review of Systems:  All other review of systems negative except as mentioned in the HPI.  Physical Exam: Vital signs BP 109/73   Pulse 66   Temp (!) 97.3 F (36.3 C) (Temporal)   Resp 10   Ht 5\' 6"  (1.676 m)   Wt 164 lb (74.4 kg)   SpO2 100%   BMI 26.47 kg/m   General:   Alert,  Well-developed, well-nourished, pleasant and cooperative in NAD Lungs:  Clear throughout to auscultation.   Heart:  Regular rate and rhythm; no murmurs, clicks, rubs,  or gallops. Abdomen:  Soft, nontender and nondistended. Normal bowel sounds.   Neuro/Psych:  Alert and cooperative. Normal mood and  affect. A and O x 3   @Janus Vlcek  Sena Slate, MD, [facility] [phone] (pager) 09/16/2022 8:06 AM@

## 2022-09-16 NOTE — Progress Notes (Signed)
0825 Ephedrine 10 mg given IV due to low BP, MD updated.     0830 Report given to PACU, vss

## 2022-09-16 NOTE — Patient Instructions (Addendum)
No polyps today!  You do have diverticulosis - thickened muscle rings and pouches in the colon wall. Please read the handout about this condition.  Next routine colonoscopy in 5 years - 2029  I appreciate the opportunity to care for you. Iva Boop, MD, FACG   YOU HAD AN ENDOSCOPIC PROCEDURE TODAY AT THE [facility]:   Refer to the procedure report that was given to you for any specific questions about what was found during the examination.  If the procedure report does not answer your questions, please call your gastroenterologist to clarify.  If you requested that your care partner not be given the details of your procedure findings, then the procedure report has been included in a sealed envelope for you to review at your convenience later.  YOU SHOULD EXPECT: Some feelings of bloating in the abdomen. Passage of more gas than usual.  Walking can help get rid of the air that was put into your GI tract during the procedure and reduce the bloating. If you had a lower endoscopy (such as a colonoscopy or flexible sigmoidoscopy) you may notice spotting of blood in your stool or on the toilet paper. If you underwent a bowel prep for your procedure, you may not have a normal bowel movement for a few days.  Please Note:  You might notice some irritation and congestion in your nose or some drainage.  This is from the oxygen used during your procedure.  There is no need for concern and it should clear up in a day or so.  SYMPTOMS TO REPORT IMMEDIATELY:  Following lower endoscopy (colonoscopy or flexible sigmoidoscopy):  Excessive amounts of blood in the stool  Significant tenderness or worsening of abdominal pains  Swelling of the abdomen that is new, acute  Fever of 100F or higher  For urgent or emergent issues, a gastroenterologist can be reached at any hour by calling [phone]. Do not use MyChart messaging for urgent concerns.    DIET:  We do recommend a small meal at  first, but then you may proceed to your regular diet.  Drink plenty of fluids but you should avoid alcoholic beverages for 24 hours.  ACTIVITY:  You should plan to take it easy for the rest of today and you should NOT DRIVE or use heavy machinery until tomorrow (because of the sedation medicines used during the test).    FOLLOW UP: Our staff will call the number listed on your records the next business day following your procedure.  We will call around 7:15- 8:00 am to check on you and address any questions or concerns that you may have regarding the information given to you following your procedure. If we do not reach you, we will leave a message.     If any biopsies were taken you will be contacted by phone or by letter within the next 1-3 weeks.  Please call us at [phone] if you have not heard about the biopsies in 3 weeks.    SIGNATURES/CONFIDENTIALITY: You and/or your care partner have signed paperwork which will be entered into your electronic medical record.  These signatures attest to the fact that that the information above on your After Visit Summary has been reviewed and is understood.  Full responsibility of the confidentiality of this discharge information lies with you and/or your care-partner.

## 2022-09-16 NOTE — Progress Notes (Signed)
Vitals-Eric  Pt's states no medical or surgical changes since previsit or office visit. 

## 2022-09-16 NOTE — Op Note (Signed)
[facility] Patient Name: Shirley Romero Procedure Date: 09/16/2022 8:02 AM MRN: [id number] Endoscopist: Iva Boop , MD, [id number] Age: 52 Referring MD:  Date of Birth: 09-04-1970 Gender: Female Account #: [id number] Procedure:                Colonoscopy Indications:              Surveillance: Personal history of adenomatous                            polyps on last colonoscopy 3 years ago, Last                            colonoscopy: 2021 Medicines:                Monitored Anesthesia Care Procedure:                Pre-Anesthesia Assessment:                           - Prior to the procedure, a History and Physical                            was performed, and patient medications and                            allergies were reviewed. The patient's tolerance of                            previous anesthesia was also reviewed. The risks                            and benefits of the procedure and the sedation                            options and risks were discussed with the patient.                            All questions were answered, and informed consent                            was obtained. Prior Anticoagulants: The patient has                            taken no anticoagulant or antiplatelet agents. ASA                            Grade Assessment: I - A normal, healthy patient.                            After reviewing the risks and benefits, the patient                            was deemed in satisfactory condition to undergo the  procedure.                           After obtaining informed consent, the colonoscope                            was passed under direct vision. Throughout the                            procedure, the patient's blood pressure, pulse, and                            oxygen saturations were monitored continuously. The                            PCF-HQ190L Colonoscope [id number] was introduced                             through the anus and advanced to the the cecum,                            identified by appendiceal orifice and ileocecal                            valve. The colonoscopy was performed without                            difficulty. The patient tolerated the procedure                            well. The quality of the bowel preparation was                            excellent. The ileocecal valve, appendiceal                            orifice, and rectum were photographed. The bowel                            preparation used was Miralax via split dose                            instruction. Scope In: 8:14:10 AM Scope Out: 8:26:28 AM Scope Withdrawal Time: 0 hours 9 minutes 41 seconds  Total Procedure Duration: 0 hours 12 minutes 18 seconds  Findings:                 The perianal and digital rectal examinations were                            normal.                           A few diverticula were found in the sigmoid colon.  Anal papilla(e) were hypertrophied.                           The exam was otherwise without abnormality on                            direct and retroflexion views. Complications:            No immediate complications. Estimated Blood Loss:     Estimated blood loss: none. Impression:               - Diverticulosis in the sigmoid colon.                           - Anal papilla(e) were hypertrophied.                           - The examination was otherwise normal on direct                            and retroflexion views.                           - No specimens collected.                           - Personal history of colonic polyps - 10 mm                            adenoma and 2 subcm hyperplastics 2021 (transverse,                            descending and digmoid). Recommendation:           - Patient has a contact number available for                            emergencies. The signs and symptoms of potential                             delayed complications were discussed with the                            patient. Return to normal activities tomorrow.                            Written discharge instructions were provided to the                            patient.                           - Resume previous diet.                           - Continue present medications.                           -  Repeat colonoscopy in 5 years for surveillance. Iva Boop, MD 09/16/2022 8:42:18 AM This report has been signed electronically.

## 2022-09-17 ENCOUNTER — Telehealth: Payer: Self-pay | Admitting: *Deleted

## 2022-09-17 NOTE — Telephone Encounter (Signed)
Attempted f/u phone call. No answer. Left message. °

## 2022-09-20 ENCOUNTER — Encounter: Payer: Self-pay | Admitting: Internal Medicine

## 2022-09-21 MED ORDER — MAGNESIUM OXIDE (ANTACID) 500 MG PO CAPS: 1.0000 | ORAL_CAPSULE | Freq: Every day | ORAL | 0 refills | Status: AC

## 2022-09-21 NOTE — Telephone Encounter (Signed)
Dr Reece AgarLorain Childes....  I placed the magnesium 500 mg on the medication list already.

## 2022-11-09 ENCOUNTER — Ambulatory Visit: Payer: BC Managed Care – PPO | Admitting: Internal Medicine
